# Patient Record
Sex: Female | Born: 1980 | Race: White | Hispanic: No | Marital: Single | Smoking: Current every day smoker
Health system: Southern US, Community
[De-identification: ages and names within clinical notes are randomized; demographics above are authoritative.]

## PROBLEM LIST (undated history)

## (undated) ENCOUNTER — Ambulatory Visit
Payer: PRIVATE HEALTH INSURANCE | Attending: Student in an Organized Health Care Education/Training Program | Primary: Student in an Organized Health Care Education/Training Program

## (undated) ENCOUNTER — Ambulatory Visit

## (undated) ENCOUNTER — Encounter: Attending: Allergy & Immunology | Primary: Allergy & Immunology

## (undated) ENCOUNTER — Encounter

## (undated) ENCOUNTER — Ambulatory Visit: Payer: PRIVATE HEALTH INSURANCE

## (undated) ENCOUNTER — Ambulatory Visit: Payer: PRIVATE HEALTH INSURANCE | Attending: Allergy & Immunology | Primary: Allergy & Immunology

## (undated) ENCOUNTER — Ambulatory Visit: Payer: PRIVATE HEALTH INSURANCE | Attending: Clinical Neurophysiology | Primary: Clinical Neurophysiology

## (undated) ENCOUNTER — Telehealth

## (undated) ENCOUNTER — Inpatient Hospital Stay (HOSPITAL_COMMUNITY): Payer: Self-pay

## (undated) DIAGNOSIS — O09529 Supervision of elderly multigravida, unspecified trimester: Secondary | ICD-10-CM

## (undated) DIAGNOSIS — Z91018 Allergy to other foods: Secondary | ICD-10-CM

## (undated) DIAGNOSIS — D649 Anemia, unspecified: Secondary | ICD-10-CM

## (undated) HISTORY — PX: ABDOMINAL HYSTERECTOMY: SHX81

## (undated) HISTORY — PX: TONSILLECTOMY: SUR1361

---

## 2013-05-21 NOTE — L&D Delivery Note (Signed)
Delivery Note At 11:39 AM a viable female was delivered via VBAC, Spontaneous (Presentation: Left Occiput Anterior).  APGAR: 8, 9; weight .   Placenta status: Intact, Spontaneous.  Cord: 3 vessels with the following complications: None.  Anesthesia: Epidural Local : 10cc administered Episiotomy: None Lacerations: bilateral labial, periurethrals, 2nd degree Suture Repair: 2.0 vicryl rapide Est. Blood Loss (mL): 350  Mom to postpartum.  Baby to Couplet care / Skin to Skin.  Sandra Meyer 02/20/2014, 12:16 PM

## 2013-07-09 ENCOUNTER — Encounter: Payer: Self-pay | Admitting: Obstetrics & Gynecology

## 2013-07-16 ENCOUNTER — Inpatient Hospital Stay (HOSPITAL_COMMUNITY)
Admission: AD | Admit: 2013-07-16 | Discharge: 2013-07-16 | Disposition: A | Discharge status: Home / Self Care | Payer: Medicaid Other | Source: Ambulatory Visit | Attending: Obstetrics & Gynecology | Admitting: Obstetrics & Gynecology

## 2013-07-16 ENCOUNTER — Encounter (HOSPITAL_COMMUNITY): Payer: Self-pay | Admitting: *Deleted

## 2013-07-16 DIAGNOSIS — O211 Hyperemesis gravidarum with metabolic disturbance: Secondary | ICD-10-CM | POA: Insufficient documentation

## 2013-07-16 DIAGNOSIS — R55 Syncope and collapse: Secondary | ICD-10-CM | POA: Insufficient documentation

## 2013-07-16 DIAGNOSIS — O219 Vomiting of pregnancy, unspecified: Secondary | ICD-10-CM

## 2013-07-16 DIAGNOSIS — Z87891 Personal history of nicotine dependence: Secondary | ICD-10-CM | POA: Insufficient documentation

## 2013-07-16 DIAGNOSIS — E860 Dehydration: Secondary | ICD-10-CM | POA: Insufficient documentation

## 2013-07-16 HISTORY — DX: Anemia, unspecified: D64.9

## 2013-07-16 LAB — CBC
HCT: 33.4 % — ABNORMAL LOW (ref 36.0–46.0)
Hemoglobin: 11.4 g/dL — ABNORMAL LOW (ref 12.0–15.0)
MCH: 29.9 pg (ref 26.0–34.0)
MCHC: 34.1 g/dL (ref 30.0–36.0)
MCV: 87.7 fL (ref 78.0–100.0)
PLATELETS: 194 10*3/uL (ref 150–400)
RBC: 3.81 MIL/uL — ABNORMAL LOW (ref 3.87–5.11)
RDW: 13.2 % (ref 11.5–15.5)
WBC: 9.3 10*3/uL (ref 4.0–10.5)

## 2013-07-16 LAB — URINALYSIS, ROUTINE W REFLEX MICROSCOPIC
BILIRUBIN URINE: NEGATIVE
Glucose, UA: NEGATIVE mg/dL
Hgb urine dipstick: NEGATIVE
KETONES UR: NEGATIVE mg/dL
Leukocytes, UA: NEGATIVE
Nitrite: NEGATIVE
Protein, ur: NEGATIVE mg/dL
Specific Gravity, Urine: >1.03 — ABNORMAL HIGH (ref 1.005–1.030)
UROBILINOGEN UA: 0.2 mg/dL (ref 0.0–1.0)
pH: 6 (ref 5.0–8.0)

## 2013-07-16 LAB — BASIC METABOLIC PANEL
BUN: 10 mg/dL (ref 6–23)
CALCIUM: 9 mg/dL (ref 8.4–10.5)
CO2: 21 meq/L (ref 19–32)
CREATININE: 0.64 mg/dL (ref 0.50–1.10)
Chloride: 103 mEq/L (ref 96–112)
GFR calc non Af Amer: >90 mL/min (ref >90)
Glucose, Bld: 85 mg/dL (ref 70–99)
Potassium: 4.3 mEq/L (ref 3.7–5.3)
Sodium: 135 mEq/L — ABNORMAL LOW (ref 137–147)

## 2013-07-16 LAB — POCT PREGNANCY, URINE: Preg Test, Ur: POSITIVE — AB

## 2013-07-16 MED ORDER — PROMETHAZINE HCL 25 MG/ML IJ SOLN
INTRAVENOUS | Status: DC
  Administered 2013-07-16: 14:00:00 via INTRAVENOUS
  Filled 2013-07-16 (×4): qty 1000

## 2013-07-16 MED ORDER — PROMETHAZINE HCL 12.5 MG PO TABS: 12.5000 mg | ORAL_TABLET | Freq: Four times a day (QID) | ORAL | Status: DC | PRN

## 2013-07-16 MED ORDER — ONDANSETRON 4 MG PO TBDP: 4.0000 mg | ORAL_TABLET | Freq: Four times a day (QID) | ORAL | Status: DC | PRN

## 2013-07-16 MED ORDER — ONDANSETRON 4 MG PO TBDP
4.0000 mg | ORAL_TABLET | Freq: Once | ORAL | Status: AC
  Administered 2013-07-16: 4 mg via ORAL
  Filled 2013-07-16: qty 1

## 2013-07-16 MED ORDER — LACTATED RINGERS IV BOLUS (SEPSIS)
1000.0000 mL | Freq: Once | INTRAVENOUS | Status: AC
  Administered 2013-07-16: 1000 mL via INTRAVENOUS

## 2013-07-16 NOTE — MAU Note (Signed)
Pt stated that she has been having non stop n/v since she has been pregnant noat keeping anything dsown. Stated she has had her vision is going in and out like she is going to pass out. Also c/o sharp pains in her right side.

## 2013-07-16 NOTE — MAU Provider Note (Signed)
Chief Complaint: Near Syncope   First Provider Initiated Contact with Patient 07/16/13 1339     SUBJECTIVE HPI: Sandra Meyer is a 33 y.o. G3P1011 at [redacted]w[redacted]d by LMP who presents with N/V, presycope.   RN note: Pt stated that she has been having non stop n/v since she has been pregnant not keeping anything down. Stated she has had her vision is going in and out like she is going to pass out. Also c/o sharp pains in her right side.  She reports retaining no food or fluids except sips of water for over 24 hours and has had daily vomiting over the last 3-4 weeks. She has felt faint and dizzy today and on other occasions but has not had any syncopal episodes. Tried Pepto-Bismol without relief. She describes having right flank pain at axillary line level of umbilicus which is related to vomiting. Not having pain at present. No lower abdominal pain or vaginal bleeding. No irritative vaginal discharge. No dysuria, urgency or frequency of urination, hematuria. NPC but has applied for [facility].  Past Medical History  Diagnosis Date  . Anemia    OB History  Gravida Para Term Preterm AB SAB TAB Ectopic Multiple Living  3 1 1  1 1    1     # Outcome Date GA Lbr Len/2nd Weight Sex Delivery Anes PTL Lv  3 TRM           2 SAB           1 GRA              Past Surgical History  Procedure Laterality Date  . Cesarean section     History   Social History  . Marital Status: Unknown    Spouse Name: N/A    Number of Children: N/A  . Years of Education: N/A   Occupational History  . Not on file.   Social History Main Topics  . Smoking status: Former Smoker    Types: Cigarettes    Quit date: 11/13/2012  . Smokeless tobacco: Not on file  . Alcohol Use: No  . Drug Use: No  . Sexual Activity: Yes   Other Topics Concern  . Not on file   Social History Narrative  . No narrative on file   No current facility-administered medications on file prior to encounter.   No current outpatient prescriptions  on file prior to encounter.   Allergies  Allergen Reactions  . Codeine Anaphylaxis    ROS: Pertinent items in HPI  OBJECTIVE Blood pressure 113/75, pulse 87, temperature 98 F (36.7 C), temperature source Oral, resp. rate 18, height 5\' 11"  (1.803 m), weight 168 lb 3.2 oz (76.295 kg), last menstrual period 05/10/2013. GENERAL: Well-developed, well-nourished female in no acute distress.  HEENT: Normocephalic HEART: RRR w/o murmur RESP: normal effort. Lungs CTA bilaterally ABDOMEN: Soft, non-tender. Uterus palpable 1-2 FB above SP EXTREMITIES: Nontender, no edema NEURO: Alert and oriented   LAB RESULTS Results for orders placed during the hospital encounter of 07/16/13 (from the past 24 hour(s))  URINALYSIS, ROUTINE W REFLEX MICROSCOPIC     Status: Abnormal   Collection Time    07/16/13  1:05 PM      Result Value Ref Range   Color, Urine YELLOW  YELLOW   APPearance CLEAR  CLEAR   Specific Gravity, Urine >1.030 (*) 1.005 - 1.030   pH 6.0  5.0 - 8.0   Glucose, UA NEGATIVE  NEGATIVE mg/dL   Hgb urine dipstick NEGATIVE  NEGATIVE   Bilirubin Urine NEGATIVE  NEGATIVE   Ketones, ur NEGATIVE  NEGATIVE mg/dL   Protein, ur NEGATIVE  NEGATIVE mg/dL   Urobilinogen, UA 0.2  0.0 - 1.0 mg/dL   Nitrite NEGATIVE  NEGATIVE   Leukocytes, UA NEGATIVE  NEGATIVE  POCT PREGNANCY, URINE     Status: Abnormal   Collection Time    07/16/13  1:31 PM      Result Value Ref Range   Preg Test, Ur POSITIVE (*) NEGATIVE  CBC     Status: Abnormal   Collection Time    07/16/13  2:20 PM      Result Value Ref Range   WBC 9.3  4.0 - 10.5 K/uL   RBC 3.81 (*) 3.87 - 5.11 MIL/uL   Hemoglobin 11.4 (*) 12.0 - 15.0 g/dL   HCT 16.133.4 (*) 09.636.0 - 04.546.0 %   MCV 87.7  78.0 - 100.0 fL   MCH 29.9  26.0 - 34.0 pg   MCHC 34.1  30.0 - 36.0 g/dL   RDW 40.913.2  81.111.5 - 91.415.5 %   Platelets 194  150 - 400 K/uL  BASIC METABOLIC PANEL     Status: Abnormal   Collection Time    07/16/13  2:20 PM      Result Value Ref Range    Sodium 135 (*) 137 - 147 mEq/L   Potassium 4.3  3.7 - 5.3 mEq/L   Chloride 103  96 - 112 mEq/L   CO2 21  19 - 32 mEq/L   Glucose, Bld 85  70 - 99 mg/dL   BUN 10  6 - 23 mg/dL   Creatinine, Ser 7.820.64  0.50 - 1.10 mg/dL   Calcium 9.0  8.4 - 95.610.5 mg/dL   GFR calc non Af Amer >90  >90 mL/min   GFR calc Af Amer >90  >90 mL/min    IMAGING No results found.  MAU COURSE  Zofran 4mg  SL and IV LR1000 with Phenergan 25 mg given with relief  ASSESSMENT 1. Near syncope   2. Nausea and vomiting in pregnancy prior to [redacted] weeks gestation   3. Dehydration, mild   G3P1011 at 3133w4d  PLAN Discharge home    Medication List    STOP taking these medications       bismuth subsalicylate 262 MG/15ML suspension  Commonly known as:  PEPTO BISMOL      TAKE these medications       ondansetron 4 MG disintegrating tablet  Commonly known as:  ZOFRAN ODT  Take 1 tablet (4 mg total) by mouth every 6 (six) hours as needed for nausea.     prenatal multivitamin Tabs tablet  Take 1 tablet by mouth daily at 12 noon.     promethazine 12.5 MG tablet  Commonly known as:  PHENERGAN  Take 1 tablet (12.5 mg total) by mouth every 6 (six) hours as needed for nausea or vomiting.       Follow-up Information   [facility].   Contact information:   [address] [place] [id number] [phone]      Danae Orleanseirdre C Glynda Soliday, CNM 07/16/2013  1:42 PM

## 2013-07-16 NOTE — Discharge Instructions (Signed)
Hyperemesis Gravidarum Diet Hyperemesis gravidarum is a severe form of morning sickness. It is characterized by frequent and severe vomiting. It happens during the first trimester of pregnancy. It may be caused by the rapid hormone changes that happen during pregnancy. It is associated with a 5% weight loss of pre-pregnancy weight. The hyperemesis diet may be used to lessen symptoms of nausea and vomiting. EATING GUIDELINES  Eat 5 to 6 small meals daily instead of 3 large meals.  Avoid foods with strong smells.  Avoid drinking 30 minutes before and after meals.  Avoid fried or high-fat foods, such as butter and cream sauces.  Starchy foods are usually well-tolerated, such as cereal, toast, bread, potatoes, pasta, rice, and pretzels.  Eat crackers before you get out of bed in the morning.  Avoid spicy foods.  Ginger may help with nausea. Add  tsp ginger to hot tea or choose ginger tea.  Continue to take your prenatal vitamins as directed by your caregiver. SAMPLE MEAL PLAN Breakfast    cup oatmeal  1 slice toast  1 tsp heart-healthy margarine  1 tsp jelly  1 scrambled egg Midmorning Snack   1 cup low-fat yogurt Lunch   Plain ham sandwich  Carrot or celery sticks  1 small apple  3 graham crackers Midafternoon Snack   Cheese and crackers Dinner  4 oz pork tenderloin  1 small baked potato  1 tsp margarine   cup broccoli   cup grapes Evening Snack  1 cup pudding Document Released: 03/04/2007 Document Revised: 07/30/2011 Document Reviewed: 10/07/2012 ExitCare Patient Information 2014 [place]. Prenatal Care [facility]    [facility]  [phone]     Phone: [phone]          [facility]                      [facility] [place]  @[email or web address]     Phone: [phone]  Phone: [phone]         [facility]     Phone:  [phone]  Phone: [phone]           [facility] [place]                hone: [phone]  Phone: [phone]         [facility] Dr. Francoise CeoBernard Marshall      Phone: [phone]  Phone: [phone]         [facility]                Phone: [phone]  [facility]   [address]Phone:[phone]    [facility]()          Phone: [phone] [facility]   Phone: [phone]

## 2013-07-19 ENCOUNTER — Inpatient Hospital Stay (HOSPITAL_COMMUNITY): Payer: Medicaid Other

## 2013-07-19 ENCOUNTER — Encounter (HOSPITAL_COMMUNITY): Payer: Self-pay | Admitting: *Deleted

## 2013-07-19 ENCOUNTER — Inpatient Hospital Stay (HOSPITAL_COMMUNITY)
Admission: AD | Admit: 2013-07-19 | Discharge: 2013-07-19 | Disposition: A | Discharge status: Home / Self Care | Payer: Medicaid Other | Source: Ambulatory Visit | Attending: Obstetrics & Gynecology | Admitting: Obstetrics & Gynecology

## 2013-07-19 DIAGNOSIS — O208 Other hemorrhage in early pregnancy: Secondary | ICD-10-CM | POA: Insufficient documentation

## 2013-07-19 DIAGNOSIS — O468X1 Other antepartum hemorrhage, first trimester: Secondary | ICD-10-CM

## 2013-07-19 DIAGNOSIS — Z87891 Personal history of nicotine dependence: Secondary | ICD-10-CM | POA: Insufficient documentation

## 2013-07-19 DIAGNOSIS — O209 Hemorrhage in early pregnancy, unspecified: Secondary | ICD-10-CM

## 2013-07-19 DIAGNOSIS — O418X10 Other specified disorders of amniotic fluid and membranes, first trimester, not applicable or unspecified: Secondary | ICD-10-CM

## 2013-07-19 DIAGNOSIS — O4590 Premature separation of placenta, unspecified, unspecified trimester: Secondary | ICD-10-CM

## 2013-07-19 LAB — CBC
HEMATOCRIT: 35.6 % — AB (ref 36.0–46.0)
HEMOGLOBIN: 12.4 g/dL (ref 12.0–15.0)
MCH: 30.5 pg (ref 26.0–34.0)
MCHC: 34.8 g/dL (ref 30.0–36.0)
MCV: 87.5 fL (ref 78.0–100.0)
Platelets: 211 10*3/uL (ref 150–400)
RBC: 4.07 MIL/uL (ref 3.87–5.11)
RDW: 13.1 % (ref 11.5–15.5)
WBC: 9.2 10*3/uL (ref 4.0–10.5)

## 2013-07-19 LAB — URINALYSIS, ROUTINE W REFLEX MICROSCOPIC
BILIRUBIN URINE: NEGATIVE
GLUCOSE, UA: NEGATIVE mg/dL
KETONES UR: NEGATIVE mg/dL
Leukocytes, UA: NEGATIVE
Nitrite: NEGATIVE
PROTEIN: NEGATIVE mg/dL
Specific Gravity, Urine: >1.03 — ABNORMAL HIGH (ref 1.005–1.030)
Urobilinogen, UA: 0.2 mg/dL (ref 0.0–1.0)
pH: 5.5 (ref 5.0–8.0)

## 2013-07-19 LAB — URINE MICROSCOPIC-ADD ON

## 2013-07-19 LAB — HCG, QUANTITATIVE, PREGNANCY: hCG, Beta Chain, Quant, S: 69775 m[IU]/mL — ABNORMAL HIGH (ref <5)

## 2013-07-19 LAB — WET PREP, GENITAL
CLUE CELLS WET PREP: NONE SEEN
TRICH WET PREP: NONE SEEN
YEAST WET PREP: NONE SEEN

## 2013-07-19 LAB — ABO/RH: ABO/RH(D): A POS

## 2013-07-19 NOTE — MAU Note (Signed)
Pt presents with complaints of vaginal bleeding after intercourse yesterday. She says it started out as pink in color and turned to a rusty brown. Pt has cramping in both of her lower sides

## 2013-07-19 NOTE — MAU Provider Note (Signed)

## 2013-07-19 NOTE — Discharge Instructions (Signed)
Vaginal Bleeding During Pregnancy, First Trimester A small amount of bleeding (spotting) from the vagina is relatively common in early pregnancy. It usually stops on its own. Various things may cause bleeding or spotting in early pregnancy. Some bleeding may be related to the pregnancy, and some may not. In most cases, the bleeding is normal and is not a problem. However, bleeding can also be a sign of something serious. Be sure to tell your health care provider about any vaginal bleeding right away. Some possible causes of vaginal bleeding during the first trimester include:  Infection or inflammation of the cervix.  Growths (polyps) on the cervix.  Miscarriage or threatened miscarriage.  Pregnancy tissue has developed outside of the uterus and in a fallopian tube (tubal pregnancy).  Tiny cysts have developed in the uterus instead of pregnancy tissue (molar pregnancy). HOME CARE INSTRUCTIONS  Watch your condition for any changes. The following actions may help to lessen any discomfort you are feeling:  Follow your health care provider's instructions for limiting your activity. If your health care provider orders bed rest, you may need to stay in bed and only get up to use the bathroom. However, your health care provider may allow you to continue light activity.  If needed, make plans for someone to help with your regular activities and responsibilities while you are on bed rest.  Keep track of the number of pads you use each day, how often you change pads, and how soaked (saturated) they are. Write this down.  Do not use tampons. Do not douche.  Do not have sexual intercourse or orgasms until approved by your health care provider.  If you pass any tissue from your vagina, save the tissue so you can show it to your health care provider.  Only take over-the-counter or prescription medicines as directed by your health care provider.  Do not take aspirin because it can make you  bleed.  Keep all follow-up appointments as directed by your health care provider. SEEK MEDICAL CARE IF:  You have any vaginal bleeding during any part of your pregnancy.  You have cramps or labor pains. SEEK IMMEDIATE MEDICAL CARE IF:   You have severe cramps in your back or belly (abdomen).  You have a fever, not controlled by medicine.  You pass large clots or tissue from your vagina.  Your bleeding increases.  You feel lightheaded or weak, or you have fainting episodes.  You have chills.  You are leaking fluid or have a gush of fluid from your vagina.  You pass out while having a bowel movement. MAKE SURE YOU:  Understand these instructions.  Will watch your condition.  Will get help right away if you are not doing well or get worse. Document Released: 02/14/2005 Document Revised: 02/25/2013 Document Reviewed: 01/12/2013 [facility] Patient Information 2014 [place].  Pelvic Rest Pelvic rest is sometimes recommended for women when:   The placenta is partially or completely covering the opening of the cervix (placenta previa).  There is bleeding between the uterine wall and the amniotic sac in the first trimester (subchorionic hemorrhage).  The cervix begins to open without labor starting (incompetent cervix, cervical insufficiency).  The labor is too early (preterm labor). HOME CARE INSTRUCTIONS  Do not have sexual intercourse, stimulation, or an orgasm.  Do not use tampons, douche, or put anything in the vagina.  Do not lift anything over 10 pounds (4.5 kg).  Avoid strenuous activity or straining your pelvic muscles. SEEK MEDICAL CARE IF:  You have any vaginal bleeding during pregnancy. Treat this as a potential emergency. °· You have cramping pain felt low in the stomach (stronger than menstrual cramps). °· You notice vaginal discharge (watery, mucus, or bloody). °· You have a low, dull backache. °· There are regular contractions or uterine  tightening. °SEEK IMMEDIATE MEDICAL CARE IF: °You have vaginal bleeding and have placenta previa.  °Document Released: 09/01/2010 Document Revised: 07/30/2011 Document Reviewed: 09/01/2010 °ExitCare® Patient Information ©2014 [employer], LLC. ° °

## 2013-07-19 NOTE — MAU Provider Note (Signed)
Chief Complaint: Vaginal Bleeding   First Provider Initiated Contact with Patient 07/19/13 1136     SUBJECTIVE HPI: Sandra Meyer is a 33 y.o. G3P1011 at [redacted]w[redacted]d by LMP who presents to maternity admissions reporting spotting x24 hours after intercourse yesterday.  She is tearful and worried about the pregnancy.  She has not yet started care as she is waiting on insurance.  She denies exposure to STDs, vaginal itching/burning, urinary symptoms, h/a, dizziness, n/v, or fever/chills.    Past Medical History  Diagnosis Date  . Anemia    Past Surgical History  Procedure Laterality Date  . Cesarean section     History   Social History  . Marital Status: Unknown    Spouse Name: N/A    Number of Children: N/A  . Years of Education: N/A   Occupational History  . Not on file.   Social History Main Topics  . Smoking status: Former Smoker    Types: Cigarettes    Quit date: 11/13/2012  . Smokeless tobacco: Never Used  . Alcohol Use: No  . Drug Use: No  . Sexual Activity: Yes    Birth Control/ Protection: None   Other Topics Concern  . Not on file   Social History Narrative  . No narrative on file   No current facility-administered medications on file prior to encounter.   Current Outpatient Prescriptions on File Prior to Encounter  Medication Sig Dispense Refill  . promethazine (PHENERGAN) 12.5 MG tablet Take 1 tablet (12.5 mg total) by mouth every 6 (six) hours as needed for nausea or vomiting.  30 tablet  0  . ondansetron (ZOFRAN ODT) 4 MG disintegrating tablet Take 1 tablet (4 mg total) by mouth every 6 (six) hours as needed for nausea.  20 tablet  0  . Prenatal Vit-Fe Fumarate-FA (PRENATAL MULTIVITAMIN) TABS tablet Take 1 tablet by mouth daily at 12 noon. Patient  Tries to take but makes her sick       Allergies  Allergen Reactions  . Codeine Anaphylaxis    ROS: Pertinent items in HPI  OBJECTIVE Blood pressure 114/74, pulse 86, resp. rate 20, height 5\' 10"  (1.778 m),  weight 74.844 kg (165 lb), last menstrual period 05/10/2013. GENERAL: Well-developed, well-nourished female in no acute distress.  HEENT: Normocephalic HEART: normal rate RESP: normal effort ABDOMEN: Soft, non-tender EXTREMITIES: Nontender, no edema NEURO: Alert and oriented Pelvic exam: Cervix pink, visually closed, without lesion, scant dark brown blood, vaginal walls and external genitalia normal Bimanual exam: Cervix 0/long/high, firm, anterior, neg CMT, uterus nontender, ~9 week size, adnexa without tenderness, enlargement, or mass  LAB RESULTS Results for orders placed during the hospital encounter of 07/19/13 (from the past 24 hour(s))  URINALYSIS, ROUTINE W REFLEX MICROSCOPIC     Status: Abnormal   Collection Time    07/19/13 10:00 AM      Result Value Ref Range   Color, Urine YELLOW  YELLOW   APPearance CLEAR  CLEAR   Specific Gravity, Urine >1.030 (*) 1.005 - 1.030   pH 5.5  5.0 - 8.0   Glucose, UA NEGATIVE  NEGATIVE mg/dL   Hgb urine dipstick SMALL (*) NEGATIVE   Bilirubin Urine NEGATIVE  NEGATIVE   Ketones, ur NEGATIVE  NEGATIVE mg/dL   Protein, ur NEGATIVE  NEGATIVE mg/dL   Urobilinogen, UA 0.2  0.0 - 1.0 mg/dL   Nitrite NEGATIVE  NEGATIVE   Leukocytes, UA NEGATIVE  NEGATIVE  URINE MICROSCOPIC-ADD ON     Status: None   Collection  Time    07/19/13 10:00 AM      Result Value Ref Range   Squamous Epithelial / LPF RARE  RARE   RBC / HPF 0-2  <3 RBC/hpf   Bacteria, UA RARE  RARE   Urine-Other MUCOUS PRESENT    CBC     Status: Abnormal   Collection Time    07/19/13 11:17 AM      Result Value Ref Range   WBC 9.2  4.0 - 10.5 K/uL   RBC 4.07  3.87 - 5.11 MIL/uL   Hemoglobin 12.4  12.0 - 15.0 g/dL   HCT 16.135.6 (*) 09.636.0 - 04.546.0 %   MCV 87.5  78.0 - 100.0 fL   MCH 30.5  26.0 - 34.0 pg   MCHC 34.8  30.0 - 36.0 g/dL   RDW 40.913.1  81.111.5 - 91.415.5 %   Platelets 211  150 - 400 K/uL  HCG, QUANTITATIVE, PREGNANCY     Status: Abnormal   Collection Time    07/19/13 11:17 AM       Result Value Ref Range   hCG, Beta Chain, Sharene ButtersQuant, [place] [id number] (*) <5 mIU/mL  ABO/RH     Status: None   Collection Time    07/19/13 11:17 AM      Result Value Ref Range   ABO/RH(D) A POS    WET PREP, GENITAL     Status: Abnormal   Collection Time    07/19/13 11:33 AM      Result Value Ref Range   Yeast Wet Prep HPF POC NONE SEEN  NONE SEEN   Trich, Wet Prep NONE SEEN  NONE SEEN   Clue Cells Wet Prep HPF POC NONE SEEN  NONE SEEN   WBC, Wet Prep HPF POC FEW (*) NONE SEEN    IMAGING [place] Ob Comp Less 14 Wks  07/19/2013   CLINICAL DATA:  Ten weeks pregnant with bleeding.  EXAM: OBSTETRIC <14 WK ULTRASOUND  TECHNIQUE: Transabdominal ultrasound was performed for evaluation of the gestation as well as the maternal uterus and adnexal regions.  COMPARISON:  None.  FINDINGS: Intrauterine gestational sac: Visualized/normal in shape.  Yolk sac:  Present  Embryo:  Present  Cardiac Activity: Present  Heart Rate: 174 bpm  CRL:   28  mm   9 w 5 d                  US EDC: 02/16/2014  Maternal uterus/adnexae: Small volume subchorionic hemorrhage. Identified anteriorly, including on image 23.  Both ovaries within normal limits. No significant free fluid.  IMPRESSION: 1. Intrauterine pregnancy of 9 weeks 5 days with fetal heart rate of 174 beats per min. 2. Small volume subchorionic hemorrhage.   Electronically Signed   By: Jeronimo GreavesKyle  Talbot M.D.   On: 07/19/2013 13:13    ASSESSMENT 1. Subchorionic hemorrhage in first trimester   2. Vaginal bleeding in pregnant patient at less than [redacted] weeks gestation     PLAN Discharge home Pt has health dept appointment tomorrow but [facility]  F/U as scheduled with health dept--[facility] as [facility]--call to make appointment.  Pelvic rest Return to MAU as needed    Medication List         ondansetron 4 MG disintegrating tablet  Commonly known as:  ZOFRAN ODT  Take 1 tablet (4 mg total) by mouth every 6 (six) hours  as needed for nausea.     prenatal multivitamin  Tabs tablet  Take 1 tablet by mouth daily at 12 noon. Patient  Tries to take but makes her sick     promethazine 12.5 MG tablet  Commonly known as:  PHENERGAN  Take 1 tablet (12.5 mg total) by mouth every 6 (six) hours as needed for nausea or vomiting.       Follow-up Information   Please follow up. (With prenatal provider of your choice. Return to MAU as needed.)       Sharen Counter Certified Nurse-Midwife 07/19/2013  2:38 PM

## 2013-07-20 ENCOUNTER — Other Ambulatory Visit: Payer: Self-pay

## 2013-07-20 LAB — OB RESULTS CONSOLE ANTIBODY SCREEN: Antibody Screen: NEGATIVE

## 2013-07-20 LAB — OB RESULTS CONSOLE RPR: RPR: NONREACTIVE

## 2013-07-20 LAB — GC/CHLAMYDIA PROBE AMP
CT Probe RNA: NEGATIVE
GC Probe RNA: NEGATIVE

## 2013-07-20 LAB — OB RESULTS CONSOLE RUBELLA ANTIBODY, IGM: RUBELLA: IMMUNE

## 2013-07-20 LAB — OB RESULTS CONSOLE GC/CHLAMYDIA
CHLAMYDIA, DNA PROBE: NEGATIVE
Gonorrhea: NEGATIVE

## 2013-07-20 LAB — OB RESULTS CONSOLE ABO/RH: RH Type: POSITIVE

## 2013-07-20 LAB — OB RESULTS CONSOLE HEPATITIS B SURFACE ANTIGEN: HEP B S AG: NEGATIVE

## 2013-07-20 LAB — OB RESULTS CONSOLE HIV ANTIBODY (ROUTINE TESTING): HIV: NONREACTIVE

## 2013-08-03 ENCOUNTER — Other Ambulatory Visit (HOSPITAL_COMMUNITY): Payer: Self-pay | Admitting: Nurse Practitioner

## 2013-08-03 DIAGNOSIS — Z3682 Encounter for antenatal screening for nuchal translucency: Secondary | ICD-10-CM

## 2013-08-11 ENCOUNTER — Other Ambulatory Visit: Payer: Self-pay

## 2013-08-11 ENCOUNTER — Encounter (HOSPITAL_COMMUNITY): Payer: Self-pay

## 2013-08-11 ENCOUNTER — Ambulatory Visit (HOSPITAL_COMMUNITY)
Admission: RE | Admit: 2013-08-11 | Discharge: 2013-08-11 | Disposition: A | Discharge status: Home / Self Care | Payer: Medicaid Other | Source: Ambulatory Visit | Attending: Nurse Practitioner | Admitting: Nurse Practitioner

## 2013-08-11 DIAGNOSIS — Z3682 Encounter for antenatal screening for nuchal translucency: Secondary | ICD-10-CM

## 2013-08-11 DIAGNOSIS — O34219 Maternal care for unspecified type scar from previous cesarean delivery: Secondary | ICD-10-CM | POA: Insufficient documentation

## 2013-08-11 DIAGNOSIS — Z3689 Encounter for other specified antenatal screening: Secondary | ICD-10-CM | POA: Insufficient documentation

## 2013-08-11 DIAGNOSIS — O351XX0 Maternal care for (suspected) chromosomal abnormality in fetus, not applicable or unspecified: Secondary | ICD-10-CM | POA: Insufficient documentation

## 2013-08-11 DIAGNOSIS — O09529 Supervision of elderly multigravida, unspecified trimester: Secondary | ICD-10-CM | POA: Insufficient documentation

## 2013-08-11 DIAGNOSIS — O3510X0 Maternal care for (suspected) chromosomal abnormality in fetus, unspecified, not applicable or unspecified: Secondary | ICD-10-CM | POA: Insufficient documentation

## 2013-08-11 HISTORY — DX: Supervision of elderly multigravida, unspecified trimester: O09.529

## 2013-08-27 ENCOUNTER — Ambulatory Visit (INDEPENDENT_AMBULATORY_CARE_PROVIDER_SITE_OTHER): Payer: Medicaid Other | Admitting: Family Medicine

## 2013-08-27 ENCOUNTER — Encounter: Payer: Self-pay | Admitting: Family Medicine

## 2013-08-27 VITALS — BP 93/69 | Temp 97.7°F | Wt 168.9 lb

## 2013-08-27 DIAGNOSIS — O34219 Maternal care for unspecified type scar from previous cesarean delivery: Secondary | ICD-10-CM

## 2013-08-27 DIAGNOSIS — Z3480 Encounter for supervision of other normal pregnancy, unspecified trimester: Secondary | ICD-10-CM

## 2013-08-27 LAB — POCT URINALYSIS DIP (DEVICE)
Bilirubin Urine: NEGATIVE
Glucose, UA: NEGATIVE mg/dL
Hgb urine dipstick: NEGATIVE
Ketones, ur: NEGATIVE mg/dL
LEUKOCYTES UA: NEGATIVE
Nitrite: NEGATIVE
Protein, ur: NEGATIVE mg/dL
Specific Gravity, Urine: 1.02 (ref 1.005–1.030)
UROBILINOGEN UA: 0.2 mg/dL (ref 0.0–1.0)
pH: 7 (ref 5.0–8.0)

## 2013-08-27 NOTE — Progress Notes (Signed)
  Subjective:    Sandra Meyer is a G3P1011 5848w3d being seen today for her first obstetrical visit.  Her obstetrical history is significant for hx of prior c/s x1 for FTP after failed induction for PROM, FOB with hep C (her hep C neg). Patient does intend to breast feed. Pregnancy history fully reviewed.  Patient reports no bleeding, no contractions, no cramping and no leaking.  Filed Vitals:   08/27/13 0928  BP: 93/69  Temp: 97.7 F (36.5 C)  Weight: 168 lb 14.4 oz (76.613 kg)    HISTORY: OB History  Gravida Para Term Preterm AB SAB TAB Ectopic Multiple Living  3 1 1  1 1    1     # Outcome Date GA Lbr Len/2nd Weight Sex Delivery Anes PTL Lv  3 CUR           2 SAB 2014          1 TRM 05/02/09  26:00  M LTCS EPI  Y     Comments: Induced because ROM and did go into spontaneous labor, c/s for ftp past 1cm. [facility]      Past Medical History  Diagnosis Date  . Anemia   . AMA (advanced maternal age) multigravida 35+    Past Surgical History  Procedure Laterality Date  . Cesarean section    . Tonsillectomy     Family History  Problem Relation Age of Onset  . Cancer Father      Exam    Uterus:   appropriate for dates  System: Breast:  deferred   Skin: normal coloration and turgor, no rashes    Neurologic: oriented, normal, normal mood   Extremities: normal strength, tone, and muscle mass   HEENT PERRLA and extra ocular movement intact   Mouth/Teeth mucous membranes moist, pharynx normal without lesions   Neck supple   Cardiovascular: regular rate and rhythm   Respiratory:  appears well, vitals normal, no respiratory distress, acyanotic, normal RR, ear and throat exam is normal, neck free of mass or lymphadenopathy, chest clear, no wheezing, crepitations, rhonchi, normal symmetric air entry   Abdomen: soft, non-tender; bowel sounds normal; no masses,  no organomegaly      Assessment:    Pregnancy: G3P1011 There are no active problems to  display for this patient.       Plan:     Initial labs reviewed from HD Prenatal vitamins. Problem list reviewed and updated. Genetic Screening reviewed- 1st trimester screen done and neg but AFP drawn today  Ultrasound discussed; fetal survey: ordered.  Follow up in 4 weeks. 50% of 30 min visit spent on counseling and coordination of care.     Sandra Meyer 08/27/2013

## 2013-08-27 NOTE — Progress Notes (Signed)
P= 78, here for initial visit- transferred from health department per patient request. Given new patient information. C/o lower back pain at times.

## 2013-08-27 NOTE — Patient Instructions (Signed)
Second Trimester of Pregnancy The second trimester is from week 13 through week 28, months 4 through 6. The second trimester is often a time when you feel your best. Your body has also adjusted to being pregnant, and you begin to feel better physically. Usually, morning sickness has lessened or quit completely, you may have more energy, and you may have an increase in appetite. The second trimester is also a time when the fetus is growing rapidly. At the end of the sixth month, the fetus is about 9 inches long and weighs about 1 pounds. You will likely begin to feel the baby move (quickening) between 18 and 20 weeks of the pregnancy. BODY CHANGES Your body goes through many changes during pregnancy. The changes vary from woman to woman.   Your weight will continue to increase. You will notice your lower abdomen bulging out.  You may begin to get stretch marks on your hips, abdomen, and breasts.  You may develop headaches that can be relieved by medicines approved by your caregiver.  You may urinate more often because the fetus is pressing on your bladder.  You may develop or continue to have heartburn as a result of your pregnancy.  You may develop constipation because certain hormones are causing the muscles that push waste through your intestines to slow down.  You may develop hemorrhoids or swollen, bulging veins (varicose veins).  You may have back pain because of the weight gain and pregnancy hormones relaxing your joints between the bones in your pelvis and as a result of a shift in weight and the muscles that support your balance.  Your breasts will continue to grow and be tender.  Your gums may bleed and may be sensitive to brushing and flossing.  Dark spots or blotches (chloasma, mask of pregnancy) may develop on your face. This will likely fade after the baby is born.  A dark line from your belly button to the pubic area (linea nigra) may appear. This will likely fade after the  baby is born. WHAT TO EXPECT AT YOUR PRENATAL VISITS During a routine prenatal visit:  You will be weighed to make sure you and the fetus are growing normally.  Your blood pressure will be taken.  Your abdomen will be measured to track your baby's growth.  The fetal heartbeat will be listened to.  Any test results from the previous visit will be discussed. Your caregiver may ask you:  How you are feeling.  If you are feeling the baby move.  If you have had any abnormal symptoms, such as leaking fluid, bleeding, severe headaches, or abdominal cramping.  If you have any questions. Other tests that may be performed during your second trimester include:  Blood tests that check for:  Low iron levels (anemia).  Gestational diabetes (between 24 and 28 weeks).  Rh antibodies.  Urine tests to check for infections, diabetes, or protein in the urine.  An ultrasound to confirm the proper growth and development of the baby.  An amniocentesis to check for possible genetic problems.  Fetal screens for spina bifida and Down syndrome. HOME CARE INSTRUCTIONS   Avoid all smoking, herbs, alcohol, and unprescribed drugs. These chemicals affect the formation and growth of the baby.  Follow your caregiver's instructions regarding medicine use. There are medicines that are either safe or unsafe to take during pregnancy.  Exercise only as directed by your caregiver. Experiencing uterine cramps is a good sign to stop exercising.  Continue to eat regular,   healthy meals.  Wear a good support bra for breast tenderness.  Do not use hot tubs, steam rooms, or saunas.  Wear your seat belt at all times when driving.  Avoid raw meat, uncooked cheese, cat litter boxes, and soil used by cats. These carry germs that can cause birth defects in the baby.  Take your prenatal vitamins.  Try taking a stool softener (if your caregiver approves) if you develop constipation. Eat more high-fiber foods,  such as fresh vegetables or fruit and whole grains. Drink plenty of fluids to keep your urine clear or pale yellow.  Take warm sitz baths to soothe any pain or discomfort caused by hemorrhoids. Use hemorrhoid cream if your caregiver approves.  If you develop varicose veins, wear support hose. Elevate your feet for 15 minutes, 3 4 times a day. Limit salt in your diet.  Avoid heavy lifting, wear low heel shoes, and practice good posture.  Rest with your legs elevated if you have leg cramps or low back pain.  Visit your dentist if you have not gone yet during your pregnancy. Use a soft toothbrush to brush your teeth and be gentle when you floss.  A sexual relationship may be continued unless your caregiver directs you otherwise.  Continue to go to all your prenatal visits as directed by your caregiver. SEEK MEDICAL CARE IF:   You have dizziness.  You have mild pelvic cramps, pelvic pressure, or nagging pain in the abdominal area.  You have persistent nausea, vomiting, or diarrhea.  You have a bad smelling vaginal discharge.  You have pain with urination. SEEK IMMEDIATE MEDICAL CARE IF:   You have a fever.  You are leaking fluid from your vagina.  You have spotting or bleeding from your vagina.  You have severe abdominal cramping or pain.  You have rapid weight gain or loss.  You have shortness of breath with chest pain.  You notice sudden or extreme swelling of your face, hands, ankles, feet, or legs.  You have not felt your baby move in over an hour.  You have severe headaches that do not go away with medicine.  You have vision changes. Document Released: 05/01/2001 Document Revised: 01/07/2013 Document Reviewed: 07/08/2012 [employer] Patient Information 2014 [employer].  

## 2013-08-29 LAB — CULTURE, OB URINE: Colony Count: >=100000

## 2013-08-31 ENCOUNTER — Other Ambulatory Visit: Payer: Self-pay | Admitting: Family Medicine

## 2013-08-31 ENCOUNTER — Encounter: Payer: Self-pay | Admitting: Family Medicine

## 2013-08-31 ENCOUNTER — Telehealth: Payer: Self-pay | Admitting: General Practice

## 2013-08-31 DIAGNOSIS — B951 Streptococcus, group B, as the cause of diseases classified elsewhere: Secondary | ICD-10-CM | POA: Insufficient documentation

## 2013-08-31 DIAGNOSIS — O2340 Unspecified infection of urinary tract in pregnancy, unspecified trimester: Principal | ICD-10-CM

## 2013-08-31 DIAGNOSIS — O2342 Unspecified infection of urinary tract in pregnancy, second trimester: Secondary | ICD-10-CM

## 2013-08-31 LAB — ALPHA FETOPROTEIN, MATERNAL
AFP: 29.1 IU/mL
Curr Gest Age: 15.3 wks.days
MoM for AFP: 1.11
OPEN SPINA BIFIDA: NEGATIVE
Osb Risk: 1:9200 {titer}

## 2013-08-31 MED ORDER — AMOXICILLIN 500 MG PO CAPS: 500.0000 mg | ORAL_CAPSULE | Freq: Three times a day (TID) | ORAL | Status: DC

## 2013-08-31 NOTE — Telephone Encounter (Signed)
Message copied by [facility], Shaleigh Laubscher L on Mon Aug 31, 2013  1:15 PM ------      Message from: Vale HavenBECK, KELI L      Created: Mon Aug 31, 2013  1:13 PM       gbs in urine. Needs treatment for UTI. amox ok to call in. ------

## 2013-08-31 NOTE — Telephone Encounter (Signed)
Called patient and informed her of results and asked what a good pharmacy would be for her antibiotic. Patient stated [facility] on [address]. Told patient I would send the Rx there and it will be available for pickup in about an hour. Patient verbalized understanding and had no further questions

## 2013-09-14 ENCOUNTER — Encounter: Payer: Self-pay | Admitting: Family Medicine

## 2013-09-14 ENCOUNTER — Encounter: Payer: Self-pay | Admitting: *Deleted

## 2013-09-14 DIAGNOSIS — Z3480 Encounter for supervision of other normal pregnancy, unspecified trimester: Secondary | ICD-10-CM

## 2013-09-14 DIAGNOSIS — B951 Streptococcus, group B, as the cause of diseases classified elsewhere: Secondary | ICD-10-CM

## 2013-09-14 DIAGNOSIS — O34219 Maternal care for unspecified type scar from previous cesarean delivery: Secondary | ICD-10-CM

## 2013-09-15 ENCOUNTER — Other Ambulatory Visit: Payer: Self-pay | Admitting: Family Medicine

## 2013-09-15 ENCOUNTER — Ambulatory Visit (HOSPITAL_COMMUNITY)
Admission: RE | Admit: 2013-09-15 | Discharge: 2013-09-15 | Disposition: A | Discharge status: Home / Self Care | Payer: Medicaid Other | Source: Ambulatory Visit | Attending: Family Medicine | Admitting: Family Medicine

## 2013-09-15 DIAGNOSIS — Z3480 Encounter for supervision of other normal pregnancy, unspecified trimester: Secondary | ICD-10-CM

## 2013-09-15 DIAGNOSIS — O34219 Maternal care for unspecified type scar from previous cesarean delivery: Secondary | ICD-10-CM

## 2013-09-15 DIAGNOSIS — Z3689 Encounter for other specified antenatal screening: Secondary | ICD-10-CM | POA: Insufficient documentation

## 2013-09-17 ENCOUNTER — Encounter: Payer: Self-pay | Admitting: Family Medicine

## 2013-09-24 ENCOUNTER — Ambulatory Visit (INDEPENDENT_AMBULATORY_CARE_PROVIDER_SITE_OTHER): Payer: Medicaid Other | Admitting: Family

## 2013-09-24 VITALS — BP 123/74 | HR 86 | Wt 175.8 lb

## 2013-09-24 DIAGNOSIS — Z3480 Encounter for supervision of other normal pregnancy, unspecified trimester: Secondary | ICD-10-CM

## 2013-09-24 DIAGNOSIS — O34219 Maternal care for unspecified type scar from previous cesarean delivery: Secondary | ICD-10-CM

## 2013-09-24 LAB — POCT URINALYSIS DIP (DEVICE)
Bilirubin Urine: NEGATIVE
Glucose, UA: NEGATIVE mg/dL
HGB URINE DIPSTICK: NEGATIVE
Ketones, ur: NEGATIVE mg/dL
Leukocytes, UA: NEGATIVE
NITRITE: NEGATIVE
PH: 7.5 (ref 5.0–8.0)
Protein, ur: NEGATIVE mg/dL
Specific Gravity, Urine: 1.02 (ref 1.005–1.030)
Urobilinogen, UA: 0.2 mg/dL (ref 0.0–1.0)

## 2013-09-24 MED ORDER — AMOXICILLIN 250 MG/5ML PO SUSR: 500.0000 mg | Freq: Three times a day (TID) | ORAL | Status: DC

## 2013-09-24 NOTE — Progress Notes (Signed)
Pt couldn't tolerate the amoxicillin.

## 2013-09-24 NOTE — Progress Notes (Signed)
Unable to tolerate pill amoxicillin.  RX for liquid Amoxicillin sent to pharmacy.  Reviewed ultrasound (normal).  Signed TOLAC consent.

## 2013-09-30 ENCOUNTER — Telehealth: Payer: Self-pay | Admitting: *Deleted

## 2013-09-30 MED ORDER — CEPHALEXIN 500 MG PO CAPS: 500.0000 mg | ORAL_CAPSULE | Freq: Four times a day (QID) | ORAL | Status: DC

## 2013-09-30 NOTE — Telephone Encounter (Addendum)
Pt left message stating that the amoxicillin liquid which she received has been making her sick and burning her throat.  She wants to know if there is anything she can do or anything else she can take.  *Note: pt was prescribed amoxicillin due to GBS in urine.   Call returned to pt and discussed her concern. She states that with both the pill form of Amoxicillin and the liquid she has the reactions of nausea, vomiting, throat burning and headache. She was able to take 2 days of the pill Rx and then had to stop due to the severity of reaction. After switching to the liquid 3 weeks later, she was able to only take 1 days doses.  I stated that she may have a sensitivity to the medication. She then said that all medication has been having this effect on her including the anti-nausea medications. I informed pt that I will discuss with the provider and call her back.  Pt voiced understanding. I consulted with Dr. Erin FullingHarraway-Smith and then called pt. I informed her that I have sent a Rx to her pharmacy for a different antibiotic.  She should stop taking Amoxicillin. She should take the next antibiotic with food and let us know if she is not able to tolerate at least most of the doses.  Pt voiced understanding.

## 2013-10-14 ENCOUNTER — Encounter: Payer: Self-pay | Admitting: *Deleted

## 2013-10-21 ENCOUNTER — Ambulatory Visit (INDEPENDENT_AMBULATORY_CARE_PROVIDER_SITE_OTHER): Payer: Medicaid Other | Admitting: Advanced Practice Midwife

## 2013-10-21 VITALS — BP 109/68 | HR 82 | Temp 97.0°F | Wt 184.7 lb

## 2013-10-21 DIAGNOSIS — Z3480 Encounter for supervision of other normal pregnancy, unspecified trimester: Secondary | ICD-10-CM

## 2013-10-21 LAB — POCT URINALYSIS DIP (DEVICE)
Bilirubin Urine: NEGATIVE
GLUCOSE, UA: NEGATIVE mg/dL
Hgb urine dipstick: NEGATIVE
KETONES UR: NEGATIVE mg/dL
Nitrite: NEGATIVE
PROTEIN: NEGATIVE mg/dL
SPECIFIC GRAVITY, URINE: 1.02 (ref 1.005–1.030)
Urobilinogen, UA: 0.2 mg/dL (ref 0.0–1.0)
pH: 7 (ref 5.0–8.0)

## 2013-10-21 MED ORDER — RANITIDINE HCL 150 MG PO TABS: 150.0000 mg | ORAL_TABLET | Freq: Two times a day (BID) | ORAL | Status: DC

## 2013-10-21 MED ORDER — DOXYLAMINE-PYRIDOXINE 10-10 MG PO TBEC: DELAYED_RELEASE_TABLET | ORAL | Status: DC

## 2013-10-21 NOTE — Progress Notes (Signed)
Diclegis prior approval form filled out and faxed. Information sheet given to patient. Patient informed to call clinic if she does not hear from her pharmacy in a week regarding Diclegis precription.

## 2013-10-21 NOTE — Progress Notes (Signed)
Pt cannot tolerate any medication or food.

## 2013-10-21 NOTE — Progress Notes (Signed)
Doing well.  Good fetal movement, denies vaginal bleeding, LOF, regular contractions.  Daily nausea/vomiting and heartburn.  Start Diclegis and Zantac. Discussed weight gain of 30 lbs in this pregnancy.  Gained 70 lbs last pregnancy.  Reviewed healthy diet.  Plan to work on nausea so pt can eat healthier foods and exercise.  Will continue to monitor.  Unable to give urine sample at time of visit, pt reports some dysuria.  She was unable to keep down abx for last UTI.  Will send for culture.

## 2013-10-25 LAB — CULTURE, OB URINE: Colony Count: 50000

## 2013-11-18 ENCOUNTER — Ambulatory Visit (INDEPENDENT_AMBULATORY_CARE_PROVIDER_SITE_OTHER): Payer: Medicaid Other | Admitting: Advanced Practice Midwife

## 2013-11-18 VITALS — BP 104/70 | HR 85 | Temp 97.1°F | Wt 191.3 lb

## 2013-11-18 DIAGNOSIS — O26899 Other specified pregnancy related conditions, unspecified trimester: Secondary | ICD-10-CM

## 2013-11-18 DIAGNOSIS — Z3492 Encounter for supervision of normal pregnancy, unspecified, second trimester: Secondary | ICD-10-CM

## 2013-11-18 DIAGNOSIS — Z23 Encounter for immunization: Secondary | ICD-10-CM

## 2013-11-18 DIAGNOSIS — R109 Unspecified abdominal pain: Secondary | ICD-10-CM

## 2013-11-18 DIAGNOSIS — Z3480 Encounter for supervision of other normal pregnancy, unspecified trimester: Secondary | ICD-10-CM

## 2013-11-18 LAB — CBC
HEMATOCRIT: 32.6 % — AB (ref 36.0–46.0)
HEMOGLOBIN: 11.1 g/dL — AB (ref 12.0–15.0)
MCH: 30.2 pg (ref 26.0–34.0)
MCHC: 34 g/dL (ref 30.0–36.0)
MCV: 88.8 fL (ref 78.0–100.0)
PLATELETS: 272 10*3/uL (ref 150–400)
RBC: 3.67 MIL/uL — AB (ref 3.87–5.11)
RDW: 13.9 % (ref 11.5–15.5)
WBC: 11.4 10*3/uL — AB (ref 4.0–10.5)

## 2013-11-18 LAB — POCT URINALYSIS DIP (DEVICE)
BILIRUBIN URINE: NEGATIVE
GLUCOSE, UA: NEGATIVE mg/dL
HGB URINE DIPSTICK: NEGATIVE
Ketones, ur: NEGATIVE mg/dL
NITRITE: NEGATIVE
PH: 7 (ref 5.0–8.0)
Protein, ur: NEGATIVE mg/dL
SPECIFIC GRAVITY, URINE: 1.02 (ref 1.005–1.030)
Urobilinogen, UA: 0.2 mg/dL (ref 0.0–1.0)

## 2013-11-18 LAB — OB RESULTS CONSOLE GBS: STREP GROUP B AG: POSITIVE

## 2013-11-18 MED ORDER — TETANUS-DIPHTH-ACELL PERTUSSIS 5-2.5-18.5 LF-MCG/0.5 IM SUSP
0.5000 mL | Freq: Once | INTRAMUSCULAR | Status: AC
Start: 2013-11-18 — End: 2013-11-18
  Administered 2013-11-18: 0.5 mL via INTRAMUSCULAR

## 2013-11-18 NOTE — Progress Notes (Signed)
28 week labs 

## 2013-11-18 NOTE — Progress Notes (Signed)
Doing well.  Good fetal movement, denies vaginal bleeding, LOF, regular contractions.  Did have some abdominal cramping a few days ago, severe enough she almost came to hospital but resolved.  Urine sent for culture. Pt less nauseous, eating more healthy foods now.  Discussed weight gain. Pt gained 70 lbs in last pregnancy.  Continue healthy diet and exercise. Glucose screen today.

## 2013-11-18 NOTE — Progress Notes (Signed)
Tubal papers signed today.  

## 2013-11-19 LAB — HIV ANTIBODY (ROUTINE TESTING W REFLEX): HIV: NONREACTIVE

## 2013-11-19 LAB — GLUCOSE TOLERANCE, 1 HOUR (50G) W/O FASTING: Glucose, 1 Hour GTT: 80 mg/dL (ref 70–140)

## 2013-11-19 LAB — RPR

## 2013-11-20 LAB — CULTURE, OB URINE: Colony Count: 5000

## 2013-12-02 ENCOUNTER — Encounter: Payer: Self-pay | Admitting: *Deleted

## 2013-12-04 ENCOUNTER — Encounter: Payer: Self-pay | Admitting: Family Medicine

## 2013-12-04 ENCOUNTER — Ambulatory Visit (INDEPENDENT_AMBULATORY_CARE_PROVIDER_SITE_OTHER): Payer: Medicaid Other | Admitting: Family Medicine

## 2013-12-04 VITALS — BP 125/77 | HR 93 | Temp 98.1°F | Wt 197.4 lb

## 2013-12-04 DIAGNOSIS — Z3480 Encounter for supervision of other normal pregnancy, unspecified trimester: Secondary | ICD-10-CM

## 2013-12-04 DIAGNOSIS — Z3483 Encounter for supervision of other normal pregnancy, third trimester: Secondary | ICD-10-CM

## 2013-12-04 LAB — POCT URINALYSIS DIP (DEVICE)
Bilirubin Urine: NEGATIVE
Glucose, UA: NEGATIVE mg/dL
HGB URINE DIPSTICK: NEGATIVE
Ketones, ur: NEGATIVE mg/dL
NITRITE: NEGATIVE
PH: 6.5 (ref 5.0–8.0)
Protein, ur: NEGATIVE mg/dL
Specific Gravity, Urine: 1.02 (ref 1.005–1.030)
UROBILINOGEN UA: 0.2 mg/dL (ref 0.0–1.0)

## 2013-12-04 NOTE — Progress Notes (Signed)
Edema-hands/feet  Educated pt on use of prenatal yoga for stretching of ligaments

## 2013-12-04 NOTE — Progress Notes (Signed)
Sandra Meyer is a 33 y.o. G3P1011 at 8523w4d for routine follow up.  She reports round ligament pain bilaterally, +FM, small number of contractions lower abdomen. See flow sheet for details.  A/P: Pregnancy at 7623w4d.  Doing well.   Pregnancy issues include GBS+  Infant feeding choice: breast Contraception choice: BTL, papers signed 09/24/13 Infant circumcision desired yes  Tdap given 11/18/13. 1 hour glucola, CBC, RPR, and HIV were done 7/1.   RH status was reviewed and pt does not need Rhogam.  Rhogam was not given today.   Childbirth and education classes were offered. Preterm labor precautions reviewed. Kick counts reviewed. Follow up 2 weeks.

## 2013-12-04 NOTE — Patient Instructions (Signed)
Third Trimester of Pregnancy The third trimester is from week 29 through week 42, months 7 through 9. The third trimester is a time when the fetus is growing rapidly. At the end of the ninth month, the fetus is about 20 inches in length and weighs 6-10 pounds.  BODY CHANGES Your body goes through many changes during pregnancy. The changes vary from woman to woman.   Your weight will continue to increase. You can expect to gain 25-35 pounds (11-16 kg) by the end of the pregnancy.  You may begin to get stretch marks on your hips, abdomen, and breasts.  You may urinate more often because the fetus is moving lower into your pelvis and pressing on your bladder.  You may develop or continue to have heartburn as a result of your pregnancy.  You may develop constipation because certain hormones are causing the muscles that push waste through your intestines to slow down.  You may develop hemorrhoids or swollen, bulging veins (varicose veins).  You may have pelvic pain because of the weight gain and pregnancy hormones relaxing your joints between the bones in your pelvis. Backaches may result from overexertion of the muscles supporting your posture.  You may have changes in your hair. These can include thickening of your hair, rapid growth, and changes in texture. Some women also have hair loss during or after pregnancy, or hair that feels dry or thin. Your hair will most likely return to normal after your baby is born.  Your breasts will continue to grow and be tender. A yellow discharge may leak from your breasts called colostrum.  Your belly button may stick out.  You may feel short of breath because of your expanding uterus.  You may notice the fetus "dropping," or moving lower in your abdomen.  You may have a bloody mucus discharge. This usually occurs a few days to a week before labor begins.  Your cervix becomes thin and soft (effaced) near your due date. WHAT TO EXPECT AT YOUR PRENATAL  EXAMS  You will have prenatal exams every 2 weeks until week 36. Then, you will have weekly prenatal exams. During a routine prenatal visit:  You will be weighed to make sure you and the fetus are growing normally.  Your blood pressure is taken.  Your abdomen will be measured to track your baby's growth.  The fetal heartbeat will be listened to.  Any test results from the previous visit will be discussed.  You may have a cervical check near your due date to see if you have effaced. At around 36 weeks, your caregiver will check your cervix. At the same time, your caregiver will also perform a test on the secretions of the vaginal tissue. This test is to determine if a type of bacteria, Group B streptococcus, is present. Your caregiver will explain this further. Your caregiver may ask you:  What your birth plan is.  How you are feeling.  If you are feeling the baby move.  If you have had any abnormal symptoms, such as leaking fluid, bleeding, severe headaches, or abdominal cramping.  If you have any questions. Other tests or screenings that may be performed during your third trimester include:  Blood tests that check for low iron levels (anemia).  Fetal testing to check the health, activity level, and growth of the fetus. Testing is done if you have certain medical conditions or if there are problems during the pregnancy. FALSE LABOR You may feel small, irregular contractions that   eventually go away. These are called Braxton Hicks contractions, or false labor. Contractions may last for hours, days, or even weeks before true labor sets in. If contractions come at regular intervals, intensify, or become painful, it is best to be seen by your caregiver.  SIGNS OF LABOR   Menstrual-like cramps.  Contractions that are 5 minutes apart or less.  Contractions that start on the top of the uterus and spread down to the lower abdomen and back.  A sense of increased pelvic pressure or back  pain.  A watery or bloody mucus discharge that comes from the vagina. If you have any of these signs before the 37th week of pregnancy, call your caregiver right away. You need to go to the hospital to get checked immediately. HOME CARE INSTRUCTIONS   Avoid all smoking, herbs, alcohol, and unprescribed drugs. These chemicals affect the formation and growth of the baby.  Follow your caregiver's instructions regarding medicine use. There are medicines that are either safe or unsafe to take during pregnancy.  Exercise only as directed by your caregiver. Experiencing uterine cramps is a good sign to stop exercising.  Continue to eat regular, healthy meals.  Wear a good support bra for breast tenderness.  Do not use hot tubs, steam rooms, or saunas.  Wear your seat belt at all times when driving.  Avoid raw meat, uncooked cheese, cat litter boxes, and soil used by cats. These carry germs that can cause birth defects in the baby.  Take your prenatal vitamins.  Try taking a stool softener (if your caregiver approves) if you develop constipation. Eat more high-fiber foods, such as fresh vegetables or fruit and whole grains. Drink plenty of fluids to keep your urine clear or pale yellow.  Take warm sitz baths to soothe any pain or discomfort caused by hemorrhoids. Use hemorrhoid cream if your caregiver approves.  If you develop varicose veins, wear support hose. Elevate your feet for 15 minutes, 3-4 times a day. Limit salt in your diet.  Avoid heavy lifting, wear low heal shoes, and practice good posture.  Rest a lot with your legs elevated if you have leg cramps or low back pain.  Visit your dentist if you have not gone during your pregnancy. Use a soft toothbrush to brush your teeth and be gentle when you floss.  A sexual relationship may be continued unless your caregiver directs you otherwise.  Do not travel far distances unless it is absolutely necessary and only with the approval  of your caregiver.  Take prenatal classes to understand, practice, and ask questions about the labor and delivery.  Make a trial run to the hospital.  Pack your hospital bag.  Prepare the baby's nursery.  Continue to go to all your prenatal visits as directed by your caregiver. SEEK MEDICAL CARE IF:  You are unsure if you are in labor or if your water has broken.  You have dizziness.  You have mild pelvic cramps, pelvic pressure, or nagging pain in your abdominal area.  You have persistent nausea, vomiting, or diarrhea.  You have a bad smelling vaginal discharge.  You have pain with urination. SEEK IMMEDIATE MEDICAL CARE IF:   You have a fever.  You are leaking fluid from your vagina.  You have spotting or bleeding from your vagina.  You have severe abdominal cramping or pain.  You have rapid weight loss or gain.  You have shortness of breath with chest pain.  You notice sudden or extreme swelling   of your face, hands, ankles, feet, or legs.  You have not felt your baby move in over an hour.  You have severe headaches that do not go away with medicine.  You have vision changes. Document Released: 05/01/2001 Document Revised: 05/12/2013 Document Reviewed: 07/08/2012 [employer] Patient Information 2015 [employer]. This information is not intended to replace advice given to you by your health care provider. Make sure you discuss any questions you have with your health care provider.  

## 2013-12-04 NOTE — Progress Notes (Signed)
I have reviewed and agree with documentation by the resident. BTL papers scanned in chart and signed on 8/1.  F/u in 2 weeks.

## 2013-12-22 ENCOUNTER — Ambulatory Visit (INDEPENDENT_AMBULATORY_CARE_PROVIDER_SITE_OTHER): Payer: Medicaid Other | Admitting: Obstetrics and Gynecology

## 2013-12-22 VITALS — BP 125/73 | HR 85 | Temp 98.1°F | Wt 198.4 lb

## 2013-12-22 DIAGNOSIS — O34219 Maternal care for unspecified type scar from previous cesarean delivery: Secondary | ICD-10-CM

## 2013-12-22 LAB — POCT URINALYSIS DIP (DEVICE)
Bilirubin Urine: NEGATIVE
Glucose, UA: NEGATIVE mg/dL
Hgb urine dipstick: NEGATIVE
Ketones, ur: NEGATIVE mg/dL
NITRITE: NEGATIVE
PH: 7 (ref 5.0–8.0)
PROTEIN: NEGATIVE mg/dL
Specific Gravity, Urine: 1.015 (ref 1.005–1.030)
Urobilinogen, UA: 0.2 mg/dL (ref 0.0–1.0)

## 2013-12-22 NOTE — Progress Notes (Signed)
Edema in hands and feet.  Reports intermittent lower abdominal cramping.

## 2013-12-22 NOTE — Patient Instructions (Signed)
Vaginal Birth After Cesarean Delivery Vaginal birth after cesarean delivery (VBAC) is giving birth vaginally after previously delivering a baby by a cesarean. In the past, if a woman had a cesarean delivery, all births afterward would be done by cesarean delivery. This is no longer true. It can be safe for the mother to try a vaginal delivery after having a cesarean delivery.  It is important to discuss VBAC with your health care provider early in the pregnancy so you can understand the risks, benefits, and options. It will give you time to decide what is best in your particular case. The final decision about whether to have a VBAC or repeat cesarean delivery should be between you and your health care provider. Any changes in your health or your baby's health during your pregnancy may make it necessary to change your initial decision about VBAC.  WOMEN WHO PLAN TO HAVE A VBAC SHOULD CHECK WITH THEIR HEALTH CARE PROVIDER TO BE SURE THAT:  The previous cesarean delivery was done with a low transverse uterine cut (incision) (not a vertical classical incision).   The birth canal is big enough for the baby.   There were no other operations on the uterus.   An electronic fetal monitor (EFM) will be on at all times during labor.   An operating room will be available and ready in case an emergency cesarean delivery is needed.   A health care provider and surgical nursing staff will be available at all times during labor to be ready to do an emergency delivery cesarean if necessary.   An anesthesiologist will be present in case an emergency cesarean delivery is needed.   The nursery is prepared and has adequate personnel and necessary equipment available to care for the baby in case of an emergency cesarean delivery. BENEFITS OF VBAC  Shorter stay in the hospital.   Avoidance of risks associated with cesarean delivery, such as:  Surgical complications, such as opening of the incision or  hernia in the incision.  Injury to other organs.  Fever. This can occur if an infection develops after surgery. It can also occur as a reaction to the medicine given to make you numb during the surgery.  Less blood loss and need for blood transfusions.  Lower risk of blood clots and infection.  Shorter recovery.   Decreased risk for having to remove the uterus (hysterectomy).   Decreased risk for the placenta to completely or partially cover the opening of the uterus (placenta previa) with a future pregnancy.   Decrease risk in future labor and delivery. RISKS OF A VBAC  Tearing (rupture) of the uterus. This is occurs in less than 1% of VBACs. The risk of this happening is higher if:  Steps are taken to begin the labor process (induce labor) or stimulate or strengthen contractions (augment labor).   Medicine is used to soften (ripen) the cervix.  Having to remove the uterus (hysterectomy) if it ruptures. VBAC SHOULD NOT BE DONE IF:  The previous cesarean delivery was done with a vertical (classical) or T-shaped incision or you do not know what kind of incision was made.   You had a ruptured uterus.   You have had certain types of surgery on your uterus, such as removal of uterine fibroids. Ask your health care provider about other types of surgeries that prevent you from having a VBAC.  You have certain medical or childbirth (obstetrical) problems.   There are problems with the baby.   You   have had two previous cesarean deliveries and no vaginal deliveries. OTHER FACTS TO KNOW ABOUT VBAC:  It is safe to have an epidural anesthetic with VBAC.   It is safe to turn the baby from a breech position (attempt an external cephalic version).   It is safe to try a VBAC with twins.   VBAC may not be successful if your baby weights 8.8 lb (4 kg) or more. However, weight predictions are not always accurate and should not be used alone to decide if VBAC is right for  you.  There is an increased failure rate if the time between the cesarean delivery and VBAC is less than 19 months.   Your health care provider may advise against a VBAC if you have preeclampsia (high blood pressure, protein in the urine, and swelling of face and extremities).   VBAC is often successful if you previously gave birth vaginally.   VBAC is often successful when the labor starts spontaneously before the due date.   Delivering a baby through a VBAC is similar to having a normal spontaneous vaginal delivery. Document Released: 10/28/2006 Document Revised: 09/21/2013 Document Reviewed: 12/04/2012 [employer] Patient Information 2015 [employer]. This information is not intended to replace advice given to you by your health care provider. Make sure you discuss any questions you have with your health care provider.  

## 2013-12-22 NOTE — Progress Notes (Signed)
Still wants TOLAC and PPS. PC/S for FTP, max dilation 1cm and BW 6+lbs> good candidate.

## 2014-01-13 ENCOUNTER — Ambulatory Visit (INDEPENDENT_AMBULATORY_CARE_PROVIDER_SITE_OTHER): Payer: Medicaid Other | Admitting: Advanced Practice Midwife

## 2014-01-13 VITALS — BP 119/73 | HR 86 | Temp 97.6°F | Wt 200.8 lb

## 2014-01-13 DIAGNOSIS — Z3493 Encounter for supervision of normal pregnancy, unspecified, third trimester: Secondary | ICD-10-CM

## 2014-01-13 DIAGNOSIS — N390 Urinary tract infection, site not specified: Secondary | ICD-10-CM

## 2014-01-13 DIAGNOSIS — Z3480 Encounter for supervision of other normal pregnancy, unspecified trimester: Secondary | ICD-10-CM

## 2014-01-13 DIAGNOSIS — O2390 Unspecified genitourinary tract infection in pregnancy, unspecified trimester: Secondary | ICD-10-CM

## 2014-01-13 DIAGNOSIS — Z3483 Encounter for supervision of other normal pregnancy, third trimester: Secondary | ICD-10-CM

## 2014-01-13 DIAGNOSIS — B951 Streptococcus, group B, as the cause of diseases classified elsewhere: Secondary | ICD-10-CM

## 2014-01-13 DIAGNOSIS — O2342 Unspecified infection of urinary tract in pregnancy, second trimester: Secondary | ICD-10-CM

## 2014-01-13 LAB — POCT URINALYSIS DIP (DEVICE)
Bilirubin Urine: NEGATIVE
Glucose, UA: NEGATIVE mg/dL
Hgb urine dipstick: NEGATIVE
LEUKOCYTES UA: NEGATIVE
Nitrite: NEGATIVE
PROTEIN: NEGATIVE mg/dL
Specific Gravity, Urine: 1.02 (ref 1.005–1.030)
Urobilinogen, UA: 0.2 mg/dL (ref 0.0–1.0)
pH: 6.5 (ref 5.0–8.0)

## 2014-01-13 NOTE — Patient Instructions (Signed)
Braxton Hicks Contractions Contractions of the uterus can occur throughout pregnancy. Contractions are not always a sign that you are in labor.  WHAT ARE BRAXTON HICKS CONTRACTIONS?  Contractions that occur before labor are called Braxton Hicks contractions, or false labor. Toward the end of pregnancy (32-34 weeks), these contractions can develop more often and may become more forceful. This is not true labor because these contractions do not result in opening (dilatation) and thinning of the cervix. They are sometimes difficult to tell apart from true labor because these contractions can be forceful and people have different pain tolerances. You should not feel embarrassed if you go to the hospital with false labor. Sometimes, the only way to tell if you are in true labor is for your health care provider to look for changes in the cervix. If there are no prenatal problems or other health problems associated with the pregnancy, it is completely safe to be sent home with false labor and await the onset of true labor. HOW CAN YOU TELL THE DIFFERENCE BETWEEN TRUE AND FALSE LABOR? False Labor  The contractions of false labor are usually shorter and not as hard as those of true labor.   The contractions are usually irregular.   The contractions are often felt in the front of the lower abdomen and in the groin.   The contractions may go away when you walk around or change positions while lying down.   The contractions get weaker and are shorter lasting as time goes on.   The contractions do not usually become progressively stronger, regular, and closer together as with true labor.  True Labor  Contractions in true labor last 30-70 seconds, become very regular, usually become more intense, and increase in frequency.   The contractions do not go away with walking.   The discomfort is usually felt in the top of the uterus and spreads to the lower abdomen and low back.   True labor can be  determined by your health care provider with an exam. This will show that the cervix is dilating and getting thinner.  WHAT TO REMEMBER  Keep up with your usual exercises and follow other instructions given by your health care provider.   Take medicines as directed by your health care provider.   Keep your regular prenatal appointments.   Eat and drink lightly if you think you are going into labor.   If Braxton Hicks contractions are making you uncomfortable:   Change your position from lying down or resting to walking, or from walking to resting.   Sit and rest in a tub of warm water.   Drink 2-3 glasses of water. Dehydration may cause these contractions.   Do slow and deep breathing several times an hour.  WHEN SHOULD I SEEK IMMEDIATE MEDICAL CARE? Seek immediate medical care if:  Your contractions become stronger, more regular, and closer together.   You have fluid leaking or gushing from your vagina.   You have a fever.   You pass blood-tinged mucus.   You have vaginal bleeding.   You have continuous abdominal pain.   You have low back pain that you never had before.   You feel your baby's head pushing down and causing pelvic pressure.   Your baby is not moving as much as it used to.  Document Released: 05/07/2005 Document Revised: 05/12/2013 Document Reviewed: 02/16/2013 Exit[employer] Patient Information 2015 [employer]. This information is not intended to replace advice given to you by your health care   provider. Make sure you discuss any questions you have with your health care provider.  Fetal Movement Counts Patient Name: __________________________________________________ Patient Due Date: ____________________ Performing a fetal movement count is highly recommended in high-risk pregnancies, but it is good for every pregnant woman to do. Your health care provider may ask you to start counting fetal movements at 28 weeks of the pregnancy. Fetal  movements often increase:  After eating a full meal.  After physical activity.  After eating or drinking something sweet or cold.  At rest. Pay attention to when you feel the baby is most active. This will help you notice a pattern of your baby's sleep and wake cycles and what factors contribute to an increase in fetal movement. It is important to perform a fetal movement count at the same time each day when your baby is normally most active.  HOW TO COUNT FETAL MOVEMENTS 1. Find a quiet and comfortable area to sit or lie down on your left side. Lying on your left side provides the best blood and oxygen circulation to your baby. 2. Write down the day and time on a sheet of paper or in a journal. 3. Start counting kicks, flutters, swishes, rolls, or jabs in a 2-hour period. You should feel at least 10 movements within 2 hours. 4. If you do not feel 10 movements in 2 hours, wait 2-3 hours and count again. Look for a change in the pattern or not enough counts in 2 hours. SEEK MEDICAL CARE IF:  You feel less than 10 counts in 2 hours, tried twice.  There is no movement in over an hour.  The pattern is changing or taking longer each day to reach 10 counts in 2 hours.  You feel the baby is not moving as he or she usually does. Date: ____________ Movements: ____________ Start time: ____________ Finish time: ____________  Date: ____________ Movements: ____________ Start time: ____________ Finish time: ____________ Date: ____________ Movements: ____________ Start time: ____________ Finish time: ____________ Date: ____________ Movements: ____________ Start time: ____________ Finish time: ____________ Date: ____________ Movements: ____________ Start time: ____________ Finish time: ____________ Date: ____________ Movements: ____________ Start time: ____________ Finish time: ____________ Date: ____________ Movements: ____________ Start time: ____________ Finish time: ____________ Date: ____________  Movements: ____________ Start time: ____________ Finish time: ____________  Date: ____________ Movements: ____________ Start time: ____________ Finish time: ____________ Date: ____________ Movements: ____________ Start time: ____________ Finish time: ____________ Date: ____________ Movements: ____________ Start time: ____________ Finish time: ____________ Date: ____________ Movements: ____________ Start time: ____________ Finish time: ____________ Date: ____________ Movements: ____________ Start time: ____________ Finish time: ____________ Date: ____________ Movements: ____________ Start time: ____________ Finish time: ____________ Date: ____________ Movements: ____________ Start time: ____________ Finish time: ____________  Date: ____________ Movements: ____________ Start time: ____________ Finish time: ____________ Date: ____________ Movements: ____________ Start time: ____________ Finish time: ____________ Date: ____________ Movements: ____________ Start time: ____________ Finish time: ____________ Date: ____________ Movements: ____________ Start time: ____________ Finish time: ____________ Date: ____________ Movements: ____________ Start time: ____________ Finish time: ____________ Date: ____________ Movements: ____________ Start time: ____________ Finish time: ____________ Date: ____________ Movements: ____________ Start time: ____________ Finish time: ____________  Date: ____________ Movements: ____________ Start time: ____________ Finish time: ____________ Date: ____________ Movements: ____________ Start time: ____________ Finish time: ____________ Date: ____________ Movements: ____________ Start time: ____________ Finish time: ____________ Date: ____________ Movements: ____________ Start time: ____________ Finish time: ____________ Date: ____________ Movements: ____________ Start time: ____________ Finish time: ____________ Date: ____________ Movements: ____________ Start time:  ____________ Finish time: ____________ Date: ____________ Movements:   ____________ Start time: ____________ Finish time: ____________  Date: ____________ Movements: ____________ Start time: ____________ Finish time: ____________ Date: ____________ Movements: ____________ Start time: ____________ Finish time: ____________ Date: ____________ Movements: ____________ Start time: ____________ Finish time: ____________ Date: ____________ Movements: ____________ Start time: ____________ Finish time: ____________ Date: ____________ Movements: ____________ Start time: ____________ Finish time: ____________ Date: ____________ Movements: ____________ Start time: ____________ Finish time: ____________ Date: ____________ Movements: ____________ Start time: ____________ Finish time: ____________  Date: ____________ Movements: ____________ Start time: ____________ Finish time: ____________ Date: ____________ Movements: ____________ Start time: ____________ Finish time: ____________ Date: ____________ Movements: ____________ Start time: ____________ Finish time: ____________ Date: ____________ Movements: ____________ Start time: ____________ Finish time: ____________ Date: ____________ Movements: ____________ Start time: ____________ Finish time: ____________ Date: ____________ Movements: ____________ Start time: ____________ Finish time: ____________ Date: ____________ Movements: ____________ Start time: ____________ Finish time: ____________  Date: ____________ Movements: ____________ Start time: ____________ Finish time: ____________ Date: ____________ Movements: ____________ Start time: ____________ Finish time: ____________ Date: ____________ Movements: ____________ Start time: ____________ Finish time: ____________ Date: ____________ Movements: ____________ Start time: ____________ Finish time: ____________ Date: ____________ Movements: ____________ Start time: ____________ Finish time: ____________ Date:  ____________ Movements: ____________ Start time: ____________ Finish time: ____________ Date: ____________ Movements: ____________ Start time: ____________ Finish time: ____________  Date: ____________ Movements: ____________ Start time: ____________ Finish time: ____________ Date: ____________ Movements: ____________ Start time: ____________ Finish time: ____________ Date: ____________ Movements: ____________ Start time: ____________ Finish time: ____________ Date: ____________ Movements: ____________ Start time: ____________ Finish time: ____________ Date: ____________ Movements: ____________ Start time: ____________ Finish time: ____________ Date: ____________ Movements: ____________ Start time: ____________ Finish time: ____________ Document Released: 06/06/2006 Document Revised: 09/21/2013 Document Reviewed: 03/03/2012 Exit[employer] Patient Information 2015 [employer]. This information is not intended to replace advice given to you by your health care provider. Make sure you discuss any questions you have with your health care provider.  

## 2014-01-14 LAB — GC/CHLAMYDIA PROBE AMP
CT PROBE, AMP APTIMA: NEGATIVE
GC Probe RNA: NEGATIVE

## 2014-01-18 NOTE — Progress Notes (Signed)
GBS in urine. PCN allergy. Able to take cephalosporins.

## 2014-01-27 ENCOUNTER — Ambulatory Visit (INDEPENDENT_AMBULATORY_CARE_PROVIDER_SITE_OTHER): Payer: Medicaid Other | Admitting: Obstetrics and Gynecology

## 2014-01-27 ENCOUNTER — Encounter: Payer: Self-pay | Admitting: Obstetrics and Gynecology

## 2014-01-27 VITALS — BP 127/73 | HR 91 | Temp 97.4°F | Wt 204.3 lb

## 2014-01-27 DIAGNOSIS — O34219 Maternal care for unspecified type scar from previous cesarean delivery: Secondary | ICD-10-CM

## 2014-01-27 DIAGNOSIS — O26899 Other specified pregnancy related conditions, unspecified trimester: Secondary | ICD-10-CM

## 2014-01-27 DIAGNOSIS — R102 Pelvic and perineal pain: Secondary | ICD-10-CM

## 2014-01-27 DIAGNOSIS — O9989 Other specified diseases and conditions complicating pregnancy, childbirth and the puerperium: Secondary | ICD-10-CM

## 2014-01-27 DIAGNOSIS — N949 Unspecified condition associated with female genital organs and menstrual cycle: Secondary | ICD-10-CM

## 2014-01-27 NOTE — Progress Notes (Signed)
For TOLAC and PPS. Had PROM and failed IOL with P1, max dilation 1 cm, 6+ #.  Signs/sx labor reviewed. RLP discussed.

## 2014-01-27 NOTE — Patient Instructions (Signed)
Third Trimester of Pregnancy The third trimester is from week 29 through week 42, months 7 through 9. The third trimester is a time when the fetus is growing rapidly. At the end of the ninth month, the fetus is about 20 inches in length and weighs 6-10 pounds.  BODY CHANGES Your body goes through many changes during pregnancy. The changes vary from woman to woman.   Your weight will continue to increase. You can expect to gain 25-35 pounds (11-16 kg) by the end of the pregnancy.  You may begin to get stretch marks on your hips, abdomen, and breasts.  You may urinate more often because the fetus is moving lower into your pelvis and pressing on your bladder.  You may develop or continue to have heartburn as a result of your pregnancy.  You may develop constipation because certain hormones are causing the muscles that push waste through your intestines to slow down.  You may develop hemorrhoids or swollen, bulging veins (varicose veins).  You may have pelvic pain because of the weight gain and pregnancy hormones relaxing your joints between the bones in your pelvis. Backaches may result from overexertion of the muscles supporting your posture.  You may have changes in your hair. These can include thickening of your hair, rapid growth, and changes in texture. Some women also have hair loss during or after pregnancy, or hair that feels dry or thin. Your hair will most likely return to normal after your baby is born.  Your breasts will continue to grow and be tender. A yellow discharge may leak from your breasts called colostrum.  Your belly button may stick out.  You may feel short of breath because of your expanding uterus.  You may notice the fetus "dropping," or moving lower in your abdomen.  You may have a bloody mucus discharge. This usually occurs a few days to a week before labor begins.  Your cervix becomes thin and soft (effaced) near your due date. WHAT TO EXPECT AT YOUR PRENATAL  EXAMS  You will have prenatal exams every 2 weeks until week 36. Then, you will have weekly prenatal exams. During a routine prenatal visit:  You will be weighed to make sure you and the fetus are growing normally.  Your blood pressure is taken.  Your abdomen will be measured to track your baby's growth.  The fetal heartbeat will be listened to.  Any test results from the previous visit will be discussed.  You may have a cervical check near your due date to see if you have effaced. At around 36 weeks, your caregiver will check your cervix. At the same time, your caregiver will also perform a test on the secretions of the vaginal tissue. This test is to determine if a type of bacteria, Group B streptococcus, is present. Your caregiver will explain this further. Your caregiver may ask you:  What your birth plan is.  How you are feeling.  If you are feeling the baby move.  If you have had any abnormal symptoms, such as leaking fluid, bleeding, severe headaches, or abdominal cramping.  If you have any questions. Other tests or screenings that may be performed during your third trimester include:  Blood tests that check for low iron levels (anemia).  Fetal testing to check the health, activity level, and growth of the fetus. Testing is done if you have certain medical conditions or if there are problems during the pregnancy. FALSE LABOR You may feel small, irregular contractions that   eventually go away. These are called Braxton Hicks contractions, or false labor. Contractions may last for hours, days, or even weeks before true labor sets in. If contractions come at regular intervals, intensify, or become painful, it is best to be seen by your caregiver.  SIGNS OF LABOR   Menstrual-like cramps.  Contractions that are 5 minutes apart or less.  Contractions that start on the top of the uterus and spread down to the lower abdomen and back.  A sense of increased pelvic pressure or back  pain.  A watery or bloody mucus discharge that comes from the vagina. If you have any of these signs before the 37th week of pregnancy, call your caregiver right away. You need to go to the hospital to get checked immediately. HOME CARE INSTRUCTIONS   Avoid all smoking, herbs, alcohol, and unprescribed drugs. These chemicals affect the formation and growth of the baby.  Follow your caregiver's instructions regarding medicine use. There are medicines that are either safe or unsafe to take during pregnancy.  Exercise only as directed by your caregiver. Experiencing uterine cramps is a good sign to stop exercising.  Continue to eat regular, healthy meals.  Wear a good support bra for breast tenderness.  Do not use hot tubs, steam rooms, or saunas.  Wear your seat belt at all times when driving.  Avoid raw meat, uncooked cheese, cat litter boxes, and soil used by cats. These carry germs that can cause birth defects in the baby.  Take your prenatal vitamins.  Try taking a stool softener (if your caregiver approves) if you develop constipation. Eat more high-fiber foods, such as fresh vegetables or fruit and whole grains. Drink plenty of fluids to keep your urine clear or pale yellow.  Take warm sitz baths to soothe any pain or discomfort caused by hemorrhoids. Use hemorrhoid cream if your caregiver approves.  If you develop varicose veins, wear support hose. Elevate your feet for 15 minutes, 3-4 times a day. Limit salt in your diet.  Avoid heavy lifting, wear low heal shoes, and practice good posture.  Rest a lot with your legs elevated if you have leg cramps or low back pain.  Visit your dentist if you have not gone during your pregnancy. Use a soft toothbrush to brush your teeth and be gentle when you floss.  A sexual relationship may be continued unless your caregiver directs you otherwise.  Do not travel far distances unless it is absolutely necessary and only with the approval  of your caregiver.  Take prenatal classes to understand, practice, and ask questions about the labor and delivery.  Make a trial run to the hospital.  Pack your hospital bag.  Prepare the baby's nursery.  Continue to go to all your prenatal visits as directed by your caregiver. SEEK MEDICAL CARE IF:  You are unsure if you are in labor or if your water has broken.  You have dizziness.  You have mild pelvic cramps, pelvic pressure, or nagging pain in your abdominal area.  You have persistent nausea, vomiting, or diarrhea.  You have a bad smelling vaginal discharge.  You have pain with urination. SEEK IMMEDIATE MEDICAL CARE IF:   You have a fever.  You are leaking fluid from your vagina.  You have spotting or bleeding from your vagina.  You have severe abdominal cramping or pain.  You have rapid weight loss or gain.  You have shortness of breath with chest pain.  You notice sudden or extreme swelling   of your face, hands, ankles, feet, or legs.  You have not felt your baby move in over an hour.  You have severe headaches that do not go away with medicine.  You have vision changes. Document Released: 05/01/2001 Document Revised: 05/12/2013 Document Reviewed: 07/08/2012 [employer] Patient Information 2015 [employer]. This information is not intended to replace advice given to you by your health care provider. Make sure you discuss any questions you have with your health care provider.  

## 2014-01-27 NOTE — Progress Notes (Signed)
Pain/pressure- vaginal pressure  Edema-hands/feet  Flu vaccine info given and pt will think about  Pt would like to have cervical check

## 2014-02-11 ENCOUNTER — Ambulatory Visit (INDEPENDENT_AMBULATORY_CARE_PROVIDER_SITE_OTHER): Payer: Medicaid Other | Admitting: Advanced Practice Midwife

## 2014-02-11 VITALS — BP 122/74 | HR 87 | Wt 207.5 lb

## 2014-02-11 DIAGNOSIS — O34219 Maternal care for unspecified type scar from previous cesarean delivery: Secondary | ICD-10-CM

## 2014-02-11 DIAGNOSIS — Z3480 Encounter for supervision of other normal pregnancy, unspecified trimester: Secondary | ICD-10-CM

## 2014-02-11 LAB — POCT URINALYSIS DIP (DEVICE)
Bilirubin Urine: NEGATIVE
Glucose, UA: NEGATIVE mg/dL
HGB URINE DIPSTICK: NEGATIVE
KETONES UR: NEGATIVE mg/dL
Nitrite: NEGATIVE
PROTEIN: NEGATIVE mg/dL
SPECIFIC GRAVITY, URINE: 1.015 (ref 1.005–1.030)
UROBILINOGEN UA: 0.2 mg/dL (ref 0.0–1.0)
pH: 7.5 (ref 5.0–8.0)

## 2014-02-11 NOTE — Patient Instructions (Signed)

## 2014-02-11 NOTE — Progress Notes (Signed)
Ready!  Reviewed labor plans. Prefers to avoid epidural if possible. Wants to walk, etc as possible. Reviewed monitoring with TOLAC.  Reviewed hosptal course and discharge (offered early ).  Would like early discharge.

## 2014-02-15 ENCOUNTER — Telehealth: Payer: Self-pay

## 2014-02-15 NOTE — Telephone Encounter (Signed)
Pt called and stated that she she [redacted] weeks pregnant she is having discomfort, no discharge or bleeding, and have not felt the baby move as much.  Called pt and pt informed me that her belly tightens at the top and that she had "this cramping feeling for 2 hours I sat down and then it went away".  I informed pt that it sounds like she is having braxton hicks contractions.  To make sure that monitors the intensity of the contraction and if walking or resting does not relieve them to monitor them. If contractions become closer than 10 minutes apart I advised her to go to the MAU and if her water breaks.  Pt stated understanding with no further questions.

## 2014-02-18 ENCOUNTER — Encounter: Payer: Self-pay | Admitting: Obstetrics and Gynecology

## 2014-02-18 ENCOUNTER — Ambulatory Visit (INDEPENDENT_AMBULATORY_CARE_PROVIDER_SITE_OTHER): Payer: Medicaid Other | Admitting: Obstetrics and Gynecology

## 2014-02-18 VITALS — BP 128/77 | HR 73 | Wt 208.2 lb

## 2014-02-18 DIAGNOSIS — O480 Post-term pregnancy: Secondary | ICD-10-CM

## 2014-02-18 LAB — US OB FOLLOW UP

## 2014-02-18 NOTE — Progress Notes (Signed)
IOL scheduled 10/5 @ 0730

## 2014-02-18 NOTE — Patient Instructions (Signed)

## 2014-02-18 NOTE — Progress Notes (Signed)
Dong well except RLP and LBP> does not want to take Tylenol. Relief measures discussed. Reviewed good dating criteria and OB hx. No labor, ROM[facility] Still wants TOL[facility]. EFW: 7#. If AFI normal will schedule IOL Monday.

## 2014-02-19 ENCOUNTER — Telehealth (HOSPITAL_COMMUNITY): Payer: Self-pay | Admitting: *Deleted

## 2014-02-19 NOTE — Telephone Encounter (Signed)
Preadmission screen  

## 2014-02-20 ENCOUNTER — Encounter (HOSPITAL_COMMUNITY): Payer: Medicaid Other | Admitting: Anesthesiology

## 2014-02-20 ENCOUNTER — Inpatient Hospital Stay (HOSPITAL_COMMUNITY)
Admission: AD | Admit: 2014-02-20 | Discharge: 2014-02-22 | DRG: 767 | Disposition: A | Discharge status: Home / Self Care | Payer: Medicaid Other | Source: Ambulatory Visit | Attending: Family Medicine | Admitting: Family Medicine

## 2014-02-20 ENCOUNTER — Encounter (HOSPITAL_COMMUNITY): Payer: Self-pay | Admitting: *Deleted

## 2014-02-20 ENCOUNTER — Inpatient Hospital Stay (HOSPITAL_COMMUNITY): Payer: Medicaid Other | Admitting: Anesthesiology

## 2014-02-20 DIAGNOSIS — O99824 Streptococcus B carrier state complicating childbirth: Secondary | ICD-10-CM | POA: Diagnosis present

## 2014-02-20 DIAGNOSIS — Z8249 Family history of ischemic heart disease and other diseases of the circulatory system: Secondary | ICD-10-CM

## 2014-02-20 DIAGNOSIS — B951 Streptococcus, group B, as the cause of diseases classified elsewhere: Secondary | ICD-10-CM

## 2014-02-20 DIAGNOSIS — O09523 Supervision of elderly multigravida, third trimester: Secondary | ICD-10-CM | POA: Diagnosis not present

## 2014-02-20 DIAGNOSIS — Z3483 Encounter for supervision of other normal pregnancy, third trimester: Secondary | ICD-10-CM | POA: Diagnosis present

## 2014-02-20 DIAGNOSIS — Z880 Allergy status to penicillin: Secondary | ICD-10-CM | POA: Diagnosis not present

## 2014-02-20 DIAGNOSIS — Z87891 Personal history of nicotine dependence: Secondary | ICD-10-CM

## 2014-02-20 DIAGNOSIS — O480 Post-term pregnancy: Secondary | ICD-10-CM | POA: Diagnosis present

## 2014-02-20 DIAGNOSIS — Z302 Encounter for sterilization: Secondary | ICD-10-CM

## 2014-02-20 DIAGNOSIS — O2342 Unspecified infection of urinary tract in pregnancy, second trimester: Secondary | ICD-10-CM

## 2014-02-20 DIAGNOSIS — O34219 Maternal care for unspecified type scar from previous cesarean delivery: Secondary | ICD-10-CM

## 2014-02-20 DIAGNOSIS — Z3A40 40 weeks gestation of pregnancy: Secondary | ICD-10-CM | POA: Diagnosis present

## 2014-02-20 DIAGNOSIS — O3421 Maternal care for scar from previous cesarean delivery: Secondary | ICD-10-CM

## 2014-02-20 LAB — TYPE AND SCREEN
ABO/RH(D): A POS
Antibody Screen: NEGATIVE

## 2014-02-20 LAB — SURGICAL PCR SCREEN
MRSA, PCR: NEGATIVE
Staphylococcus aureus: NEGATIVE

## 2014-02-20 LAB — RPR

## 2014-02-20 LAB — CBC
HEMATOCRIT: 33.7 % — AB (ref 36.0–46.0)
HEMOGLOBIN: 11.3 g/dL — AB (ref 12.0–15.0)
MCH: 29.7 pg (ref 26.0–34.0)
MCHC: 33.5 g/dL (ref 30.0–36.0)
MCV: 88.7 fL (ref 78.0–100.0)
Platelets: 223 10*3/uL (ref 150–400)
RBC: 3.8 MIL/uL — ABNORMAL LOW (ref 3.87–5.11)
RDW: 14.8 % (ref 11.5–15.5)
WBC: 13.1 10*3/uL — ABNORMAL HIGH (ref 4.0–10.5)

## 2014-02-20 MED ORDER — ONDANSETRON HCL 4 MG PO TABS: 4.0000 mg | ORAL_TABLET | ORAL | Status: DC | PRN

## 2014-02-20 MED ORDER — LANOLIN HYDROUS EX OINT
TOPICAL_OINTMENT | CUTANEOUS | Status: DC | PRN
Start: 2014-02-20 — End: 2014-02-22

## 2014-02-20 MED ORDER — ONDANSETRON HCL 4 MG/2ML IJ SOLN: 4.0000 mg | INTRAMUSCULAR | Status: DC | PRN

## 2014-02-20 MED ORDER — ZOLPIDEM TARTRATE 5 MG PO TABS: 5.0000 mg | ORAL_TABLET | Freq: Every evening | ORAL | Status: DC | PRN

## 2014-02-20 MED ORDER — DIBUCAINE 1 % RE OINT: 1.0000 "application " | TOPICAL_OINTMENT | RECTAL | Status: DC | PRN

## 2014-02-20 MED ORDER — LACTATED RINGERS IV SOLN
500.0000 mL | Freq: Once | INTRAVENOUS | Status: AC
  Administered 2014-02-20: 500 mL via INTRAVENOUS

## 2014-02-20 MED ORDER — OXYTOCIN BOLUS FROM INFUSION: 500.0000 mL | INTRAVENOUS | Status: DC

## 2014-02-20 MED ORDER — DIPHENHYDRAMINE HCL 25 MG PO CAPS
25.0000 mg | ORAL_CAPSULE | Freq: Four times a day (QID) | ORAL | Status: DC | PRN
Start: 2014-02-20 — End: 2014-02-22

## 2014-02-20 MED ORDER — ONDANSETRON HCL 4 MG/2ML IJ SOLN
4.0000 mg | Freq: Four times a day (QID) | INTRAMUSCULAR | Status: DC | PRN
Start: 2014-02-20 — End: 2014-02-20

## 2014-02-20 MED ORDER — LACTATED RINGERS IV SOLN: 500.0000 mL | INTRAVENOUS | Status: DC | PRN

## 2014-02-20 MED ORDER — CITRIC ACID-SODIUM CITRATE 334-500 MG/5ML PO SOLN: 30.0000 mL | ORAL | Status: DC | PRN

## 2014-02-20 MED ORDER — CEFAZOLIN SODIUM 1-5 GM-% IV SOLN
1.0000 g | Freq: Three times a day (TID) | INTRAVENOUS | Status: DC
  Filled 2014-02-20 (×3): qty 50

## 2014-02-20 MED ORDER — OXYCODONE-ACETAMINOPHEN 5-325 MG PO TABS: 1.0000 | ORAL_TABLET | ORAL | Status: DC | PRN

## 2014-02-20 MED ORDER — PHENYLEPHRINE 40 MCG/ML (10ML) SYRINGE FOR IV PUSH (FOR BLOOD PRESSURE SUPPORT)
80.0000 ug | PREFILLED_SYRINGE | INTRAVENOUS | Status: DC | PRN
  Filled 2014-02-20: qty 2
  Filled 2014-02-20: qty 10

## 2014-02-20 MED ORDER — BENZOCAINE-MENTHOL 20-0.5 % EX AERO
1.0000 | INHALATION_SPRAY | CUTANEOUS | Status: DC | PRN
Start: 2014-02-20 — End: 2014-02-22
  Administered 2014-02-20: 1 via TOPICAL
  Filled 2014-02-20: qty 56

## 2014-02-20 MED ORDER — WITCH HAZEL-GLYCERIN EX PADS: 1.0000 "application " | MEDICATED_PAD | CUTANEOUS | Status: DC | PRN

## 2014-02-20 MED ORDER — TETANUS-DIPHTH-ACELL PERTUSSIS 5-2.5-18.5 LF-MCG/0.5 IM SUSP
0.5000 mL | Freq: Once | INTRAMUSCULAR | Status: DC
Start: 2014-02-21 — End: 2014-02-20

## 2014-02-20 MED ORDER — SIMETHICONE 80 MG PO CHEW
80.0000 mg | CHEWABLE_TABLET | ORAL | Status: DC | PRN
  Administered 2014-02-21: 80 mg via ORAL
  Filled 2014-02-20: qty 1

## 2014-02-20 MED ORDER — EPHEDRINE 5 MG/ML INJ
10.0000 mg | INTRAVENOUS | Status: DC | PRN
  Filled 2014-02-20: qty 2

## 2014-02-20 MED ORDER — LACTATED RINGERS IV SOLN
INTRAVENOUS | Status: DC
  Administered 2014-02-20: 11:00:00 via INTRAUTERINE

## 2014-02-20 MED ORDER — FENTANYL CITRATE 0.05 MG/ML IJ SOLN
100.0000 ug | INTRAMUSCULAR | Status: DC | PRN
  Administered 2014-02-20: 100 ug via INTRAVENOUS
  Filled 2014-02-20: qty 2

## 2014-02-20 MED ORDER — PRENATAL MULTIVITAMIN CH
1.0000 | ORAL_TABLET | Freq: Every day | ORAL | Status: DC
  Administered 2014-02-21 – 2014-02-22 (×2): 1 via ORAL
  Filled 2014-02-20 (×2): qty 1

## 2014-02-20 MED ORDER — IBUPROFEN 600 MG PO TABS
600.0000 mg | ORAL_TABLET | Freq: Four times a day (QID) | ORAL | Status: DC
  Administered 2014-02-20 – 2014-02-22 (×8): 600 mg via ORAL
  Filled 2014-02-20 (×8): qty 1

## 2014-02-20 MED ORDER — CEFAZOLIN SODIUM-DEXTROSE 2-3 GM-% IV SOLR
2.0000 g | Freq: Once | INTRAVENOUS | Status: AC
  Administered 2014-02-20: 2 g via INTRAVENOUS
  Filled 2014-02-20: qty 50

## 2014-02-20 MED ORDER — ACETAMINOPHEN 325 MG PO TABS: 650.0000 mg | ORAL_TABLET | ORAL | Status: DC | PRN

## 2014-02-20 MED ORDER — FLEET ENEMA 7-19 GM/118ML RE ENEM
1.0000 | ENEMA | RECTAL | Status: DC | PRN
Start: 2014-02-20 — End: 2014-02-20

## 2014-02-20 MED ORDER — LIDOCAINE HCL (PF) 1 % IJ SOLN
INTRAMUSCULAR | Status: DC | PRN
  Administered 2014-02-20 (×2): 4 mL

## 2014-02-20 MED ORDER — FENTANYL 2.5 MCG/ML BUPIVACAINE 1/10 % EPIDURAL INFUSION (WH - ANES)
14.0000 mL/h | INTRAMUSCULAR | Status: DC | PRN
  Administered 2014-02-20: 14 mL/h via EPIDURAL
  Filled 2014-02-20: qty 125

## 2014-02-20 MED ORDER — LACTATED RINGERS IV SOLN
INTRAVENOUS | Status: DC
  Administered 2014-02-20 (×2): via INTRAVENOUS

## 2014-02-20 MED ORDER — PHENYLEPHRINE 40 MCG/ML (10ML) SYRINGE FOR IV PUSH (FOR BLOOD PRESSURE SUPPORT)
80.0000 ug | PREFILLED_SYRINGE | INTRAVENOUS | Status: DC | PRN
Start: 2014-02-20 — End: 2014-02-20
  Filled 2014-02-20: qty 2

## 2014-02-20 MED ORDER — OXYTOCIN 40 UNITS IN LACTATED RINGERS INFUSION - SIMPLE MED
62.5000 mL/h | INTRAVENOUS | Status: DC
  Filled 2014-02-20: qty 1000

## 2014-02-20 MED ORDER — DIPHENHYDRAMINE HCL 50 MG/ML IJ SOLN: 12.5000 mg | INTRAMUSCULAR | Status: DC | PRN

## 2014-02-20 MED ORDER — SENNOSIDES-DOCUSATE SODIUM 8.6-50 MG PO TABS
2.0000 | ORAL_TABLET | ORAL | Status: DC
  Administered 2014-02-20 – 2014-02-21 (×2): 2 via ORAL
  Filled 2014-02-20 (×2): qty 2

## 2014-02-20 MED ORDER — OXYCODONE-ACETAMINOPHEN 5-325 MG PO TABS: 2.0000 | ORAL_TABLET | ORAL | Status: DC | PRN

## 2014-02-20 MED ORDER — FENTANYL 2.5 MCG/ML BUPIVACAINE 1/10 % EPIDURAL INFUSION (WH - ANES)
INTRAMUSCULAR | Status: DC | PRN
  Administered 2014-02-20: 14 mL/h via EPIDURAL

## 2014-02-20 MED ORDER — FENTANYL 2.5 MCG/ML BUPIVACAINE 1/10 % EPIDURAL INFUSION (WH - ANES): 14.0000 mL/h | INTRAMUSCULAR | Status: DC | PRN

## 2014-02-20 MED ORDER — LIDOCAINE HCL (PF) 1 % IJ SOLN
30.0000 mL | INTRAMUSCULAR | Status: DC | PRN
  Administered 2014-02-20: 30 mL via SUBCUTANEOUS
  Filled 2014-02-20: qty 30

## 2014-02-20 NOTE — Anesthesia Preprocedure Evaluation (Signed)
Anesthesia Evaluation  Patient identified by MRN, date of birth, ID band Patient awake    Reviewed: Allergy & Precautions, H&P , NPO status , Patient's Chart, lab work & pertinent test results  Airway Mallampati: II TM Distance: >3 FB Neck ROM: Full    Dental no notable dental hx.    Pulmonary former smoker,  breath sounds clear to auscultation  Pulmonary exam normal       Cardiovascular negative cardio ROS  Rhythm:Regular Rate:Normal     Neuro/Psych negative neurological ROS  negative psych ROS   GI/Hepatic negative GI ROS, Neg liver ROS,   Endo/Other  negative endocrine ROS  Renal/GU negative Renal ROS     Musculoskeletal negative musculoskeletal ROS (+)   Abdominal   Peds  Hematology  (+) anemia ,   Anesthesia Other Findings   Reproductive/Obstetrics (+) Pregnancy                           Anesthesia Physical Anesthesia Plan  ASA: II  Anesthesia Plan: Epidural   Post-op Pain Management:    Induction:   Airway Management Planned:   Additional Equipment:   Intra-op Plan:   Post-operative Plan:   Informed Consent: I have reviewed the patients History and Physical, chart, labs and discussed the procedure including the risks, benefits and alternatives for the proposed anesthesia with the patient or authorized representative who has indicated his/her understanding and acceptance.     Plan Discussed with:   Anesthesia Plan Comments:         Anesthesia Quick Evaluation

## 2014-02-20 NOTE — Progress Notes (Signed)
Sandra Meyer is a 33 y.o. G3P1011 at 628w5d by LMP admitted for postdates/early labor  Subjective: Uncomfortable, lots of pressure  Objective: BP 117/77  Pulse 73  Temp(Src) 97.7 F (36.5 C) (Oral)  Resp 18  Ht 5' 11.5" (1.816 m)  Wt 94.802 kg (209 lb)  BMI 28.75 kg/m2  LMP 05/11/2013      FHT:  FHR: 130 bpm, variability: moderate,  accelerations:  Present,  decelerations:  Absent UC:   regular, every 2 minutes SVE:   Dilation: 9 Effacement (%): 100 Station: 0 Exam by:: n licato rn and Dr Delford Fieldwright  Labs: Lab Results  Component Value Date   WBC 13.1* 02/20/2014   HGB 11.3* 02/20/2014   HCT 33.7* 02/20/2014   MCV 88.7 02/20/2014   PLT 223 02/20/2014    Assessment / Plan: Spontaneous labor, progressing normally. SROM around 0700, expect delivery shortly  Labor: Progressing normally Preeclampsia:  no signs or symptoms of toxicity Fetal Wellbeing:  Category I Pain Control:  Labor support without medications I/D:  cefazolin started @0632  Anticipated MOD:  NSVD  Tawni CarnesWight, Celester Morgan 02/20/2014, 7:02 AM

## 2014-02-20 NOTE — Consult Note (Addendum)
Neonatology Note:   Attendance at Delivery:    I was asked by Dr. Loreta AveAcosta to attend this VBAC at term [facility] The mother is a G3P1A1 A pos, GBS positive, on IV antibiotics > 4 hours PTD. ROM 4 hours prior to delivery, fluid clear. Infant vigorous with good spontaneous cry and tone. Needed only minimal bulb suctioning. Ap 8/9. Lungs clear to ausc in DR. To CN to care of Pediatrician.   Doretha Souhristie C. Sharena Dibenedetto, MD

## 2014-02-20 NOTE — Progress Notes (Signed)
Reported patient still reporting 10/10 pain after fentanyl. Request SVE. Dr. Waynetta SandyWight ordered RN to do SVE.

## 2014-02-20 NOTE — H&P (Signed)
Sandra Meyer is a 33 y.o. female at [redacted]w[redacted]d presenting for contractions/early labor. History  Contraction pain started around 0100, severe contraction pain primarily in back. She denies any bleeding, gush of fluid. +FM but says baby has not been moving as much since the contractions started. Has not taken anything for the pain, wants to have as natural a birth as possible. Contraction pain much stronger than first pregnancy which ended in CS for FTP. No headache, no RUQ pain, no changes in vision.  Clinic   [facility]    Dating   LMP Ultrasound consistent with LMP: Yes    Genetic Screen   1 Screen: nml AFP: neg    Anatomic [place]   normal    GTT   Early: Third trimester: nl- 80    TDaP vaccine   7/1    Flu vaccine     GBS   Positive (urine initial labs) - PENICILLIN ALLERGY-able to take cephalosporins.    Baby Food   breast    Contraception   Wants BTL   Circumcision   Yes    Pediatrician      OB History   Grav Para Term Preterm Abortions TAB SAB Ect Mult Living   3 1 1  1  1   1     2010 LTCS: Induced because ROM and did go into spontaneous labor, c/s for ftp past 1cm. Born at [facility]   Past Medical History  Diagnosis Date  . Anemia   . AMA (advanced maternal age) multigravida 35+    Past Surgical History  Procedure Laterality Date  . Cesarean section    . Tonsillectomy     Family History: family history includes Cancer in her father; Hypertension in her mother; Varicose Veins in her mother. Social History:  reports that she quit smoking about 15 months ago. Her smoking use included Cigarettes. She smoked 0.00 packs per day. She has never used smokeless tobacco. She reports that she does not drink alcohol or use illicit drugs.   Prenatal Transfer Tool  Maternal Diabetes: No Genetic Screening: Normal Maternal Ultrasounds/Referrals: Normal Fetal Ultrasounds or other Referrals:  None Maternal Substance Abuse:  No Significant Maternal Medications:  None Significant Maternal  Lab Results:  Lab values include: Group B Strep positive Other Comments:  None  Review of Systems  Constitutional: Negative for fever.  Eyes: Negative for blurred vision.  Respiratory: Negative for shortness of breath.   Cardiovascular: Negative for chest pain.  Gastrointestinal: Positive for abdominal pain.  Musculoskeletal: Positive for back pain.  Neurological: Negative for headaches.    Dilation: 1 Effacement (%): 60 Station: -3 Exam by:: Denise Collison,RN Blood pressure 126/76, pulse 78, temperature 97.8 F (36.6 C), temperature source Oral, resp. rate 18, height 5' 11.5" (1.816 m), weight 94.802 kg (209 lb), last menstrual period 05/11/2013. Maternal Exam:  Uterine Assessment: Contraction strength is moderate.  Contraction duration is 1 minute. Contraction frequency is regular.   Abdomen: Surgical scars: low transverse.   Fetal presentation: vertex  Introitus: Ferning test: not done.  Nitrazine test: not done. Amniotic fluid character: not assessed.  Pelvis: adequate for delivery.   Cervix: Cervix evaluated by digital exam.     Fetal Exam Fetal Monitor Review: Mode: ultrasound.   Baseline rate: 150.  Variability: moderate (6-25 bpm).   Pattern: accelerations present and variable decelerations.    Fetal State Assessment: Category II - tracings are indeterminate.     Physical Exam  Constitutional: She is oriented to person, place,  and time. She appears well-developed and well-nourished.  HENT:  Head: Normocephalic and atraumatic.  Cardiovascular: Normal rate, regular rhythm, normal heart sounds and intact distal pulses.   No murmur heard. Respiratory: Effort normal and breath sounds normal. She has no wheezes.  GI: There is tenderness (mild, diffuse).  Neurological: She is alert and oriented to person, place, and time.  Skin: Skin is warm and dry.  Psychiatric: She has a normal mood and affect. Her behavior is normal.    Prenatal labs: ABO, Rh:  A/Positive/-- (03/02 0000) Antibody: Negative (03/02 0000) Rubella: Immune (03/02 0000) RPR: NON REAC (07/01 1103)  HBsAg: Negative (03/02 0000)  HIV: NONREACTIVE (07/01 1103)  GBS: Positive (07/01 0000)   Assessment/Plan: [name] is a 33 y.o. G3P1011 at 2543w5d by LMP here for postdates pregnancy/early labor  #Labor: early, plan on foley bulb #Pain: Declines IV and epidural currently #FWB: Cat II, minimal variability at times, few variable decels #ID:  GBS+, PCN allergy but can use cephalosporins. Cefazolin #MOF: breast #MOC:BTL #Circ:  Yes   Tawni CarnesWight, Andrew 02/20/2014, 3:48 AM  I have seen and examined this patient and I agree with the above. Pt desires VBAC- consent on chart. Also desires BTL- consent on chart. Cam HaiSHAW, Adilene Areola CNM 9:25 AM 02/20/2014

## 2014-02-20 NOTE — MAU Note (Signed)
Pt reports ctx strong for the past 2 hours denise SROM or bleeidng . Good fetal movement.

## 2014-02-20 NOTE — Anesthesia Postprocedure Evaluation (Signed)
  Anesthesia Post-op Note  Patient: Sandra Meyer  Procedure(s) Performed: * No procedures listed *  Patient Location: Mother/Baby  Anesthesia Type:Epidural  Level of Consciousness: awake  Airway and Oxygen Therapy: Patient Spontanous Breathing  Post-op Pain: mild  Post-op Assessment: Patient's Cardiovascular Status Stable and Respiratory Function Stable  Post-op Vital Signs: stable  Last Vitals:  Filed Vitals:   02/20/14 1432  BP: 120/67  Pulse: 77  Temp: 36.8 C  Resp: 17    Complications: No apparent anesthesia complications

## 2014-02-20 NOTE — Anesthesia Procedure Notes (Signed)
Epidural Patient location during procedure: OB  Staffing Anesthesiologist: Quamaine Webb R Performed by: anesthesiologist   Preanesthetic Checklist Completed: patient identified, pre-op evaluation, timeout performed, IV checked, risks and benefits discussed and monitors and equipment checked  Epidural Patient position: sitting Prep: site prepped and draped and DuraPrep Patient monitoring: heart rate Approach: midline Location: L3-L4 Injection technique: LOR air and LOR saline  Needle:  Needle type: Tuohy  Needle gauge: 17 G Needle length: 9 cm Needle insertion depth: 6 cm Catheter type: closed end flexible Catheter size: 19 Gauge Catheter at skin depth: 12 cm Test dose: negative  Assessment Sensory level: T8 Events: blood not aspirated, injection not painful, no injection resistance, negative IV test and no paresthesia  Additional Notes Reason for block:procedure for pain   

## 2014-02-21 ENCOUNTER — Inpatient Hospital Stay (HOSPITAL_COMMUNITY): Payer: Medicaid Other | Admitting: Anesthesiology

## 2014-02-21 ENCOUNTER — Encounter (HOSPITAL_COMMUNITY): Payer: Self-pay

## 2014-02-21 ENCOUNTER — Encounter (HOSPITAL_COMMUNITY): Payer: Medicaid Other | Admitting: Anesthesiology

## 2014-02-21 ENCOUNTER — Encounter (HOSPITAL_COMMUNITY)
Admission: AD | Disposition: A | Discharge status: Home / Self Care | Payer: Self-pay | Source: Ambulatory Visit | Attending: Family Medicine

## 2014-02-21 DIAGNOSIS — Z302 Encounter for sterilization: Secondary | ICD-10-CM

## 2014-02-21 HISTORY — PX: TUBAL LIGATION: SHX77

## 2014-02-21 SURGERY — LIGATION, FALLOPIAN TUBE, POSTPARTUM
Anesthesia: Epidural | Site: Abdomen | Laterality: Bilateral

## 2014-02-21 MED ORDER — ONDANSETRON HCL 4 MG/2ML IJ SOLN
INTRAMUSCULAR | Status: DC | PRN
  Administered 2014-02-21: 4 mg via INTRAVENOUS

## 2014-02-21 MED ORDER — FENTANYL CITRATE 0.05 MG/ML IJ SOLN
INTRAMUSCULAR | Status: AC
  Filled 2014-02-21: qty 2

## 2014-02-21 MED ORDER — SODIUM BICARBONATE 8.4 % IV SOLN
INTRAVENOUS | Status: DC | PRN
  Administered 2014-02-21: 5 mL via EPIDURAL
  Administered 2014-02-21: 7 mL via EPIDURAL
  Administered 2014-02-21: 5 mL via EPIDURAL
  Administered 2014-02-21: 3 mL via EPIDURAL

## 2014-02-21 MED ORDER — ONDANSETRON HCL 4 MG/2ML IJ SOLN
INTRAMUSCULAR | Status: AC
  Filled 2014-02-21: qty 2

## 2014-02-21 MED ORDER — MEPERIDINE HCL 25 MG/ML IJ SOLN: 6.2500 mg | INTRAMUSCULAR | Status: DC | PRN

## 2014-02-21 MED ORDER — MIDAZOLAM HCL 2 MG/2ML IJ SOLN: 0.5000 mg | Freq: Once | INTRAMUSCULAR | Status: DC | PRN

## 2014-02-21 MED ORDER — MIDAZOLAM HCL 2 MG/2ML IJ SOLN
INTRAMUSCULAR | Status: DC | PRN
  Administered 2014-02-21 (×2): 1 mg via INTRAVENOUS

## 2014-02-21 MED ORDER — LACTATED RINGERS IV SOLN
INTRAVENOUS | Status: DC | PRN
  Administered 2014-02-21 (×2): via INTRAVENOUS

## 2014-02-21 MED ORDER — FAMOTIDINE 20 MG PO TABS
40.0000 mg | ORAL_TABLET | Freq: Once | ORAL | Status: AC
  Administered 2014-02-21: 40 mg via ORAL
  Filled 2014-02-21: qty 2

## 2014-02-21 MED ORDER — LIDOCAINE-EPINEPHRINE (PF) 2 %-1:200000 IJ SOLN
INTRAMUSCULAR | Status: AC
  Filled 2014-02-21: qty 20

## 2014-02-21 MED ORDER — KETOROLAC TROMETHAMINE 30 MG/ML IJ SOLN: 15.0000 mg | Freq: Once | INTRAMUSCULAR | Status: DC | PRN

## 2014-02-21 MED ORDER — PROMETHAZINE HCL 25 MG/ML IJ SOLN: 6.2500 mg | INTRAMUSCULAR | Status: DC | PRN

## 2014-02-21 MED ORDER — BUPIVACAINE HCL (PF) 0.25 % IJ SOLN
INTRAMUSCULAR | Status: DC | PRN
  Administered 2014-02-21: 10 mL

## 2014-02-21 MED ORDER — BUPIVACAINE HCL (PF) 0.25 % IJ SOLN
INTRAMUSCULAR | Status: AC
  Filled 2014-02-21: qty 30

## 2014-02-21 MED ORDER — FENTANYL CITRATE 0.05 MG/ML IJ SOLN
INTRAMUSCULAR | Status: DC | PRN
  Administered 2014-02-21 (×2): 50 ug via INTRAVENOUS

## 2014-02-21 MED ORDER — FENTANYL CITRATE 0.05 MG/ML IJ SOLN: 25.0000 ug | INTRAMUSCULAR | Status: DC | PRN

## 2014-02-21 MED ORDER — LANOLIN HYDROUS EX OINT: TOPICAL_OINTMENT | CUTANEOUS | Status: DC | PRN

## 2014-02-21 MED ORDER — SODIUM BICARBONATE 8.4 % IV SOLN
INTRAVENOUS | Status: AC
  Filled 2014-02-21: qty 50

## 2014-02-21 MED ORDER — LACTATED RINGERS IV SOLN
INTRAVENOUS | Status: DC
  Administered 2014-02-21: 20 mL/h via INTRAVENOUS

## 2014-02-21 MED ORDER — MIDAZOLAM HCL 2 MG/2ML IJ SOLN
INTRAMUSCULAR | Status: AC
  Filled 2014-02-21: qty 2

## 2014-02-21 MED ORDER — METOCLOPRAMIDE HCL 10 MG PO TABS
10.0000 mg | ORAL_TABLET | Freq: Once | ORAL | Status: AC
  Administered 2014-02-21: 10 mg via ORAL
  Filled 2014-02-21: qty 1

## 2014-02-21 MED ORDER — TRAMADOL HCL 50 MG PO TABS
50.0000 mg | ORAL_TABLET | Freq: Four times a day (QID) | ORAL | Status: DC | PRN
  Administered 2014-02-21: 50 mg via ORAL
  Filled 2014-02-21: qty 1

## 2014-02-21 SURGICAL SUPPLY — 19 items
CHLORAPREP W/TINT 26ML (MISCELLANEOUS) ×3 IMPLANT
CONTAINER PREFILL 10% NBF 15ML (MISCELLANEOUS) ×6 IMPLANT
DRSG OPSITE POSTOP 3X4 (GAUZE/BANDAGES/DRESSINGS) ×3 IMPLANT
GLOVE BIOGEL PI IND STRL 6.5 (GLOVE) ×1 IMPLANT
GLOVE BIOGEL PI INDICATOR 6.5 (GLOVE) ×2
GLOVE SURG SS PI 6.0 STRL IVOR (GLOVE) ×3 IMPLANT
GOWN STRL REUS W/TWL LRG LVL3 (GOWN DISPOSABLE) ×6 IMPLANT
NEEDLE HYPO 25X1 1.5 SAFETY (NEEDLE) IMPLANT
NS IRRIG 1000ML POUR BTL (IV SOLUTION) ×3 IMPLANT
PACK ABDOMINAL MINOR (CUSTOM PROCEDURE TRAY) ×3 IMPLANT
SPONGE LAP 4X18 X RAY DECT (DISPOSABLE) IMPLANT
SUT PLAIN 0 NONE (SUTURE) ×3 IMPLANT
SUT VIC AB 0 CT1 27 (SUTURE) ×2
SUT VIC AB 0 CT1 27XBRD ANBCTR (SUTURE) ×1 IMPLANT
SUT VIC AB 3-0 PS2 18 (SUTURE) ×3 IMPLANT
SYR CONTROL 10ML LL (SYRINGE) IMPLANT
TOWEL OR 17X24 6PK STRL BLUE (TOWEL DISPOSABLE) ×6 IMPLANT
TRAY FOLEY CATH 14FR (SET/KITS/TRAYS/PACK) ×3 IMPLANT
WATER STERILE IRR 1000ML POUR (IV SOLUTION) ×3 IMPLANT

## 2014-02-21 NOTE — Progress Notes (Signed)
33 y.o. yo [id number]  status post vaginal delivery who desires permanent sterilization. Risks and benefits of the procedure were discussed with the patient including permanence of method, bleeding, infection, injury to surrounding organs and need for additional procedures. Risk failure of 0.5-1% with increased risk of ectopic gestation if pregnancy occurs was also discussed with patient.

## 2014-02-21 NOTE — Anesthesia Postprocedure Evaluation (Signed)
  Anesthesia Post-op Note  Patient: Sandra CoopAmanda Meyer  Procedure(s) Performed: Procedure(s): POST PARTUM TUBAL LIGATION (Bilateral)  Patient Location: Mother/Baby  Anesthesia Type:Epidural  Level of Consciousness: awake  Airway and Oxygen Therapy: Patient Spontanous Breathing  Post-op Pain: mild  Post-op Assessment: Patient's Cardiovascular Status Stable and Respiratory Function Stable  Post-op Vital Signs: stable  Last Vitals:  Filed Vitals:   02/21/14 1256  BP: 121/75  Pulse: 86  Temp: 36.5 C  Resp: 18    Complications: No apparent anesthesia complications

## 2014-02-21 NOTE — Op Note (Signed)
PREOPERATIVE DIAGNOSIS:  Undesired fertility  POSTOPERATIVE DIAGNOSIS:  Undesired fertility  PROCEDURE:  Postpartum Bilateral Tubal Sterilization using Pomeroy method   ANESTHESIA:  Epidural  COMPLICATIONS:  None immediate.  ESTIMATED BLOOD LOSS:  Less than 20cc.  FLUIDS: 1000 cc LR.  INDICATIONS: 33 y.o. yo [id number]  with undesired fertility,status post vaginal delivery, who desires permanent sterilization. Risks and benefits of procedure discussed with patient including permanence of method, bleeding, infection, injury to surrounding organs and need for additional procedures. Risk failure of 0.5-1% with increased risk of ectopic gestation if pregnancy occurs was also discussed with patient.   FINDINGS:  Normal uterus, tubes, and ovaries.  TECHNIQUE: After informed consent was obtained, the patient was taken to the operating room where anesthesia was induced and found to be adequate. A small transverse, infraumbilical skin incision was made with the scalpel. This incision was carried down to the underlying layer of fascia. The fascia was grasped with Kocher clamps tented up and entered sharply with Mayo scissors. Underlying peritoneum was then identified tented up and entered sharply with Metzenbaum scissors. The fascia was tagged with 0 Vicryl. The patient's left fallopian tube was then identified, brought to the incision, and grasped with a Babcock clamp. The tube was then followed out to the fimbria. The Babcock clamp was then used to grasp the tube approximately 4 cm from the cornual region. A 3 cm segment of the tube was then ligated with free tie of plain gut suture, transected and excised. Good hemostasis was noted and the tube was returned to the abdomen. The right fallopian tube was then identified to its fimbriated end, ligated, and a 3 cm segment excised in a similar fashion. Excellent hemostasis was noted, and the tube returned to the abdomen. The fascia was re-approximated with 0  Vicryl. The skin was closed in a subcuticular fashion with 3-0 Vicryl. Quarter percent Marcaine solution was then injected at the incision site. The patient tolerated the procedure well. Sponge, lap, and needle count were correct x2. The patient was taken to recovery room in stable condition.

## 2014-02-21 NOTE — Progress Notes (Signed)
Post Partum Day 1, GBS+ s/p VBAC  Subjective:  Penelope Coopmanda Drees is a 33 y.o. [id number] 6015w5d s/p VBAC.  No acute events overnight.  Pt denies problems with ambulating, voiding or po intake.  She denies nausea or vomiting.  Pain is moderately controlled.  Plan for birth control is BTL.  Method of Feeding: breast  Objective: Blood pressure 120/79, pulse 83, temperature 98.1 F (36.7 C), temperature source Oral, resp. rate 18, height 5' 11.5" (1.816 m), weight 209 lb (94.802 kg), last menstrual period 05/11/2013, SpO2 99.00%, unknown if currently breastfeeding. Body mass index is 28.75 kg/(m^2).  Physical Exam:  General: alert, cooperative and no distress Lochia:normal flow Chest: CTAB Heart: RRR no m/r/g Abdomen: +BS, soft, nontender,  Uterine Fundus: firm, below umbilicus DVT Evaluation: No evidence of DVT seen on physical exam. Extremities:  edema   Recent Labs  02/20/14 0405  HGB 11.3*  HCT 33.7*    Assessment/Plan:  ASSESSMENT: Penelope Coopmanda Padovano is a 33 y.o. [id number] 5415w5d s/p VBAC PPD#1  NPO for BTL today scheduled for 0900 Plan for discharge likely tomorrow   LOS: 1 day   Lin Glazier ROCIO 02/21/2014, 6:58 AM

## 2014-02-21 NOTE — Anesthesia Preprocedure Evaluation (Signed)
Anesthesia Evaluation  Patient identified by MRN, date of birth, ID band Patient awake    Reviewed: Allergy & Precautions, H&P , NPO status , Patient's Chart, lab work & pertinent test results  Airway Mallampati: II TM Distance: >3 FB Neck ROM: Full    Dental no notable dental hx.    Pulmonary former smoker,  breath sounds clear to auscultation  Pulmonary exam normal       Cardiovascular negative cardio ROS  Rhythm:Regular Rate:Normal     Neuro/Psych negative neurological ROS  negative psych ROS   GI/Hepatic negative GI ROS, Neg liver ROS,   Endo/Other  negative endocrine ROS  Renal/GU negative Renal ROS     Musculoskeletal negative musculoskeletal ROS (+)   Abdominal   Peds  Hematology  (+) anemia ,   Anesthesia Other Findings   Reproductive/Obstetrics                           Anesthesia Physical  Anesthesia Plan  ASA: II  Anesthesia Plan: Epidural   Post-op Pain Management:    Induction:   Airway Management Planned:   Additional Equipment:   Intra-op Plan:   Post-operative Plan:   Informed Consent: I have reviewed the patients History and Physical, chart, labs and discussed the procedure including the risks, benefits and alternatives for the proposed anesthesia with the patient or authorized representative who has indicated his/her understanding and acceptance.     Plan Discussed with:   Anesthesia Plan Comments:         Anesthesia Quick Evaluation

## 2014-02-21 NOTE — Lactation Note (Signed)
This note was copied from the chart of Sandra Meyer. Lactation Consultation Note Follow up visit at 29 hours of age.  Baby has fed 12 times in 24 hours with 4 stools and when asked mom reports a wet diaper at 3am and 3pm today.  Baby fussy and showing feeding cues now.  Mom attempts to position baby with cross cradle but is hurting for a tubal today.  Assisted with pillow support for football hold and mom continues to complain of soreness on her bottom so we reclined back.  Baby initially allowed by mom to latch shallow with few week sucks.  Showed mom how to unlatch baby and to compress breast for a deep latch.  Baby does well with more rhythmic sucking  And good strong jaw excursions.  Encouraged mom to continue to work on depth so baby gets a good supply of milk at a feeding.  Encouraged mom to document feedings and output.  Mom to call for assist as needed.     Patient Name: Sandra Meyer Reason for consult: Follow-up assessment   Maternal Data    Feeding Feeding Type: Breast Fed Length of feed:  (observed 10 minutes)  LATCH Score/Interventions Latch: Grasps breast easily, tongue down, lips flanged, rhythmical sucking.  Audible Swallowing: A few with stimulation Intervention(s): Skin to skin;Hand expression  Type of Nipple: Everted at rest and after stimulation  Comfort (Breast/Nipple): Soft / non-tender     Hold (Positioning): Assistance needed to correctly position infant at breast and maintain latch. Intervention(s): Skin to skin;Position options;Support Pillows;Breastfeeding basics reviewed  LATCH Score: 8  Lactation Tools Discussed/Used     Consult Status Consult Status: Follow-up Date: 02/22/14 Follow-up type: In-patient    Sandra Meyer, Sandra Meyer Meyer, 5:25 PM

## 2014-02-21 NOTE — Anesthesia Postprocedure Evaluation (Signed)
  Anesthesia Post Note  Patient: Landscape architectAmanda Meyer  Procedure(s) Performed: Procedure(s) (LRB): POST PARTUM TUBAL LIGATION (Bilateral)  Anesthesia type: Epidural  Patient location: PACU  Post pain: Pain level controlled  Post assessment: Post-op Vital signs reviewed  Last Vitals:  Filed Vitals:   02/21/14 1000  BP: 94/47  Pulse: 99  Temp: 36.7 C  Resp: 13    Post vital signs: Reviewed  Level of consciousness: awake  Complications: No apparent anesthesia complications

## 2014-02-21 NOTE — Transfer of Care (Signed)
Immediate Anesthesia Transfer of Care Note  Patient: Sandra Meyer  Procedure(s) Performed: Procedure(s): POST PARTUM TUBAL LIGATION (Bilateral)  Patient Location: PACU  Anesthesia Type:Epidural  Level of Consciousness: awake, alert  and oriented  Airway & Oxygen Therapy: Patient Spontanous Breathing  Post-op Assessment: Report given to PACU RN and Post -op Vital signs reviewed and stable  Post vital signs: Reviewed and stable  Complications: No apparent anesthesia complications

## 2014-02-21 NOTE — Addendum Note (Signed)
Addendum created 02/21/14 1505 by Renford DillsJanet L Cesare Sumlin, CRNA   Modules edited: Notes Section   Notes Section:  File: [id number]

## 2014-02-22 ENCOUNTER — Inpatient Hospital Stay (HOSPITAL_COMMUNITY): Admission: RE | Admit: 2014-02-22 | Payer: Medicaid Other | Source: Ambulatory Visit

## 2014-02-22 ENCOUNTER — Encounter (HOSPITAL_COMMUNITY): Payer: Self-pay | Admitting: Obstetrics and Gynecology

## 2014-02-22 MED ORDER — IBUPROFEN 600 MG PO TABS: 600.0000 mg | ORAL_TABLET | Freq: Four times a day (QID) | ORAL | Status: DC

## 2014-02-22 NOTE — Progress Notes (Signed)
UR chart review completed.  

## 2014-02-22 NOTE — Discharge Instructions (Signed)
Vaginal Delivery, Care After °Refer to this sheet in the next few weeks. These discharge instructions provide you with information on caring for yourself after delivery. Your caregiver may also give you specific instructions. Your treatment has been planned according to the most current medical practices available, but problems sometimes occur. Call your caregiver if you have any problems or questions after you go home. °HOME CARE INSTRUCTIONS °· Take over-the-counter or prescription medicines only as directed by your caregiver or pharmacist. °· Do not drink alcohol, especially if you are breastfeeding or taking medicine to relieve pain. °· Do not chew or smoke tobacco. °· Do not use illegal drugs. °· Continue to use good perineal care. Good perineal care includes: °¨ Wiping your perineum from front to back. °¨ Keeping your perineum clean. °· Do not use tampons or douche until your caregiver says it is okay. °· Shower, wash your hair, and take tub baths as directed by your caregiver. °· Wear a well-fitting bra that provides breast support. °· Eat healthy foods. °· Drink enough fluids to keep your urine clear or pale yellow. °· Eat high-fiber foods such as whole grain cereals and breads, brown rice, beans, and fresh fruits and vegetables every day. These foods may help prevent or relieve constipation. °· Follow your caregiver's recommendations regarding resumption of activities such as climbing stairs, driving, lifting, exercising, or traveling. °· Talk to your caregiver about resuming sexual activities. Resumption of sexual activities is dependent upon your risk of infection, your rate of healing, and your comfort and desire to resume sexual activity. °· Try to have someone help you with your household activities and your newborn for at least a few days after you leave the hospital. °· Rest as much as possible. Try to rest or take a nap when your newborn is sleeping. °· Increase your activities gradually. °· Keep  all of your scheduled postpartum appointments. It is very important to keep your scheduled follow-up appointments. At these appointments, your caregiver will be checking to make sure that you are healing physically and emotionally. °SEEK MEDICAL CARE IF:  °· You are passing large clots from your vagina. Save any clots to show your caregiver. °· You have a foul smelling discharge from your vagina. °· You have trouble urinating. °· You are urinating frequently. °· You have pain when you urinate. °· You have a change in your bowel movements. °· You have increasing redness, pain, or swelling near your vaginal incision (episiotomy) or vaginal tear. °· You have pus draining from your episiotomy or vaginal tear. °· Your episiotomy or vaginal tear is separating. °· You have painful, hard, or reddened breasts. °· You have a severe headache. °· You have blurred vision or see spots. °· You feel sad or depressed. °· You have thoughts of hurting yourself or your newborn. °· You have questions about your care, the care of your newborn, or medicines. °· You are dizzy or light-headed. °· You have a rash. °· You have nausea or vomiting. °· You were breastfeeding and have not had a menstrual period within 12 weeks after you stopped breastfeeding. °· You are not breastfeeding and have not had a menstrual period by the 12th week after delivery. °· You have a fever. °SEEK IMMEDIATE MEDICAL CARE IF:  °· You have persistent pain. °· You have chest pain. °· You have shortness of breath. °· You faint. °· You have leg pain. °· You have stomach pain. °· Your vaginal bleeding saturates two or more sanitary pads   in 1 hour. °MAKE SURE YOU:  °· Understand these instructions. °· Will watch your condition. °· Will get help right away if you are not doing well or get worse. °Document Released: 05/04/2000 Document Revised: 09/21/2013 Document Reviewed: 01/02/2012 °ExitCare® Patient Information ©2015 [employer]. This information is not intended to  replace advice given to you by your health care provider. Make sure you discuss any questions you have with your health care provider. ° °Postpartum Tubal Ligation °Care After °Refer to this sheet in the next few weeks. These instructions provide you with information on caring for yourself after your procedure. Your caregiver may also give you more specific instructions. Your treatment has been planned according to current medical practices, but problems sometimes occur. Call your caregiver if you have any problems or questions after your procedure. °HOME CARE INSTRUCTIONS  °· Rest the remainder of the day. °· Only take over-the-counter or prescription medicines for pain, discomfort, or fever as directed by your caregiver. Do not take aspirin. It can cause bleeding. °· Gradually resume daily activities, diet, rest, driving, and work. °· Avoid sexual intercourse for 2 weeks or as directed. °· Do not drive while taking pain medicine. °· Do not lift anything over 5 pounds for 2 weeks or as directed. °· Only take showers, not baths, until you are seen by your caregiver. °· Change bandages (dressings) as directed. °· Take your temperature twice a day and record it. °· Try to have help for the first 7-10 days for your household needs. °· Return to your caregiver to get your stitches (sutures) removed and for follow-up visits as directed. °SEEK MEDICAL CARE IF:  °· You have redness, swelling, or increasing pain in the wound. °· You have drainage from the wound lasting longer than 1 day. °· Your pain is getting worse. °· You have a rash. °· You become dizzy or lightheaded. °· You have a reaction to your medicine. °· You need stronger medicine or a change in your pain medicine. °· You notice a bad smell coming from the wound or dressing. °· Your wound breaks open after the sutures have been removed. °· You are constipated. °SEEK IMMEDIATE MEDICAL CARE IF:  °· You faint. °· You have a fever. °· You have increasing abdominal  pain. °· You have severe pain in your shoulders. °· You have bleeding or drainage from the suture sites or vagina following surgery. °· You have shortness of breath or difficulty breathing. °· You have chest or leg pain. °· You have persistent nausea, vomiting, or diarrhea. °MAKE SURE YOU:  °· Understand these instructions. °· Watch your condition. °· Get help right away if you are not doing well or get worse. °Document Released: 11/06/2011 Document Reviewed: 11/06/2011 °ExitCare® Patient Information ©2015 [employer]. This information is not intended to replace advice given to you by your health care provider. Make sure you discuss any questions you have with your health care provider. ° ° °

## 2014-02-22 NOTE — Lactation Note (Signed)
This note was copied from the [facility]. Lactation Consultation Note: Follow up visit with this experienced BF mom . She has baby latched to breast when I went into room. Reports he is nursing well but was hungry during the night. So she gave some formula during the night. Encouraged to always BF first then give formula if baby still acts hungry. Reports that breasts are feeling a little fuller this morning and she is able to hand express easier. No questions at present. To call prn  Patient Name: Sandra Meyer ZOXWR'UToday's Date: 02/22/2014 Reason for consult: Follow-up assessment   Maternal Data Formula Feeding for Exclusion: No Has patient been taught Hand Expression?: Yes Does the patient have breastfeeding experience prior to this delivery?: Yes  Feeding Feeding Type: Breast Fed  LATCH Score/Interventions Latch: Grasps breast easily, tongue down, lips flanged, rhythmical sucking. Intervention(s): Adjust position  Audible Swallowing: A few with stimulation Intervention(s): Skin to skin;Hand expression Intervention(s): Skin to skin;Hand expression  Type of Nipple: Everted at rest and after stimulation  Comfort (Breast/Nipple): Soft / non-tender     Hold (Positioning): No assistance needed to correctly position infant at breast.  LATCH Score: 9  Lactation Tools Discussed/Used     Consult Status Consult Status: Complete    Pamelia HoitWeeks, Sandra Meyer 02/22/2014, 9:25 AM

## 2014-02-22 NOTE — Progress Notes (Signed)
Post Partum Day 2 Subjective: up ad lib, voiding, tolerating PO and + flatus Complains of mild tenderness over incision site  Objective: Blood pressure 108/63, pulse 75, temperature 98.1 F (36.7 C), temperature source Oral, resp. rate 17, height 5' 11.5" (1.816 m), weight 94.802 kg (209 lb), last menstrual period 05/11/2013, SpO2 97.00%, unknown if currently breastfeeding.  Physical Exam:  General: alert, cooperative and appears stated age Lochia: appropriate Uterine Fundus: firm Incision: healing well, no significant drainage DVT Evaluation: No evidence of DVT seen on physical exam. No cords or calf tenderness. No significant calf/ankle edema.   Recent Labs  02/20/14 0405  HGB 11.3*  HCT 33.7*    Assessment/Plan: Discharge home and Breastfeeding Contraception: BLT POD#1 Circumcision is being discussed with FOB    LOS: 2 days   Rosemary HolmsHaller, Terrence Wishon 02/22/2014, 7:28 AM

## 2014-02-22 NOTE — Progress Notes (Signed)
I saw and evaluated the patient. I agree with the findings and the plan of care as documented in the resident's note

## 2014-02-22 NOTE — Discharge Summary (Signed)
OB fellow attestation:  I have seen and examined this patient; I agree with above documentation in the resident's note.   Sandra Meyer is a 33 y.o. [id number] s/p NSVD. Doing well, bleeding less than menses. + ambulation, passing gas. Pain well controlled.   PE: BP 110/72  Pulse 81  Temp(Src) 98 F (36.7 C) (Oral)  Resp 18  Ht 5' 11.5" (1.816 m)  Wt 209 lb (94.802 kg)  BMI 28.75 kg/m2  SpO2 97%  LMP 05/11/2013  Breastfeeding? Unknown Gen: calm comfortable, NAD Resp: normal effort, no distress Abd: uterus below umbilcius  ROS, labs, PMH reviewed    Plan: - d/c home - f/up reg OB 4-6 wsks   Elita Booneoberts, Christine Schiefelbein C, MD 10:37 AM

## 2014-02-22 NOTE — Discharge Summary (Signed)
Obstetric Discharge Summary Reason for Admission: onset of labor Prenatal Procedures: none Intrapartum Procedures: Vaginal delivery after c-section Postpartum Procedures: P.P. tubal ligation Complications-Operative and Postpartum: 2nd degree perineal laceration Hemoglobin  Date Value Ref Range Status  02/20/2014 11.3* 12.0 - 15.0 g/dL Final     HCT  Date Value Ref Range Status  02/20/2014 33.7* 36.0 - 46.0 % Final    Physical Exam:  General: alert and cooperative Lochia: appropriate Uterine Fundus: firm Incision: healing well, no significant drainage DVT Evaluation: No evidence of DVT seen on physical exam.  Discharge Diagnoses: Term Pregnancy-delivered  Discharge Information: Date: 02/22/2014 Activity: pelvic rest Diet: routine Medications: Ibuprofen Condition: stable Instructions: refer to practice specific booklet Discharge to: home   Newborn Data: Live born female  Birth Weight: 6 lb 13.2 oz (3095 g) APGAR: 8, 9  Home with mother.  Rosemary HolmsHaller, Sandra Meyer 02/22/2014, 9:07 AM

## 2014-03-09 ENCOUNTER — Telehealth: Payer: Self-pay | Admitting: *Deleted

## 2014-03-09 NOTE — Telephone Encounter (Signed)
Attempted to contact patient, left message for patient to come by at earliest convenience to fill out paper work.

## 2014-03-22 ENCOUNTER — Encounter (HOSPITAL_COMMUNITY): Payer: Self-pay | Admitting: Obstetrics and Gynecology

## 2014-03-29 ENCOUNTER — Ambulatory Visit: Payer: Medicaid Other | Admitting: Obstetrics & Gynecology

## 2014-04-19 ENCOUNTER — Encounter: Payer: Self-pay | Admitting: Obstetrics & Gynecology

## 2014-04-19 ENCOUNTER — Ambulatory Visit: Payer: Medicaid Other | Admitting: Obstetrics & Gynecology

## 2014-04-19 ENCOUNTER — Telehealth: Payer: Self-pay | Admitting: *Deleted

## 2014-04-19 NOTE — Telephone Encounter (Signed)
Contacted patient and informed of missed appointment.  Pt states she will call and reschedule in the am.

## 2014-04-20 ENCOUNTER — Encounter: Payer: Self-pay | Admitting: Obstetrics & Gynecology

## 2014-05-03 ENCOUNTER — Telehealth: Payer: Self-pay | Admitting: *Deleted

## 2014-05-03 NOTE — Telephone Encounter (Signed)
Pt called clinic to inquire if she had the TDap vaccine due to her children being hospitalized.  Attempted to contact patient, left message that she had the Tdap vaccine 11/18/13. If she has any further questions to call the clinic.

## 2014-05-17 ENCOUNTER — Encounter: Payer: Self-pay | Admitting: *Deleted

## 2015-04-17 ENCOUNTER — Emergency Department (HOSPITAL_COMMUNITY): Payer: Medicaid Other

## 2015-04-17 ENCOUNTER — Emergency Department (HOSPITAL_COMMUNITY)
Admission: EM | Admit: 2015-04-17 | Discharge: 2015-04-17 | Disposition: A | Discharge status: Home / Self Care | Payer: Medicaid Other | Attending: Emergency Medicine | Admitting: Emergency Medicine

## 2015-04-17 DIAGNOSIS — Z3202 Encounter for pregnancy test, result negative: Secondary | ICD-10-CM | POA: Diagnosis not present

## 2015-04-17 DIAGNOSIS — M545 Low back pain: Secondary | ICD-10-CM

## 2015-04-17 DIAGNOSIS — Z862 Personal history of diseases of the blood and blood-forming organs and certain disorders involving the immune mechanism: Secondary | ICD-10-CM | POA: Insufficient documentation

## 2015-04-17 DIAGNOSIS — M5440 Lumbago with sciatica, unspecified side: Secondary | ICD-10-CM | POA: Insufficient documentation

## 2015-04-17 DIAGNOSIS — Z87891 Personal history of nicotine dependence: Secondary | ICD-10-CM | POA: Diagnosis not present

## 2015-04-17 DIAGNOSIS — Z79899 Other long term (current) drug therapy: Secondary | ICD-10-CM | POA: Diagnosis not present

## 2015-04-17 DIAGNOSIS — Z880 Allergy status to penicillin: Secondary | ICD-10-CM | POA: Insufficient documentation

## 2015-04-17 LAB — I-STAT BETA HCG BLOOD, ED (MC, WL, AP ONLY): I-stat hCG, quantitative: <5 m[IU]/mL (ref <5)

## 2015-04-17 MED ORDER — LIDOCAINE 5 % EX PTCH: 1.0000 | MEDICATED_PATCH | CUTANEOUS | Status: DC

## 2015-04-17 MED ORDER — KETOROLAC TROMETHAMINE 60 MG/2ML IM SOLN
60.0000 mg | Freq: Once | INTRAMUSCULAR | Status: AC
  Administered 2015-04-17: 60 mg via INTRAMUSCULAR
  Filled 2015-04-17: qty 2

## 2015-04-17 NOTE — Discharge Instructions (Signed)
Please follow-up with community health and wellness in order to establish primary care for further evaluation and management of your back pain. There is not appear to be an emergent cause her symptoms at this time. Your x-rays were negative. Return to ED for any new or worsening symptoms.  Back Pain, Adult Back pain is very common in adults.The cause of back pain is rarely dangerous and the pain often gets better over time.The cause of your back pain may not be known. Some common causes of back pain include:  Strain of the muscles or ligaments supporting the spine.  Wear and tear (degeneration) of the spinal disks.  Arthritis.  Direct injury to the back. For many people, back pain may return. Since back pain is rarely dangerous, most people can learn to manage this condition on their own. HOME CARE INSTRUCTIONS Watch your back pain for any changes. The following actions may help to lessen any discomfort you are feeling:  Remain active. It is stressful on your back to sit or stand in one place for long periods of time. Do not sit, drive, or stand in one place for more than 30 minutes at a time. Take short walks on even surfaces as soon as you are able.Try to increase the length of time you walk each day.  Exercise regularly as directed by your health care provider. Exercise helps your back heal faster. It also helps avoid future injury by keeping your muscles strong and flexible.  Do not stay in bed.Resting more than 1-2 days can delay your recovery.  Pay attention to your body when you bend and lift. The most comfortable positions are those that put less stress on your recovering back. Always use proper lifting techniques, including:  Bending your knees.  Keeping the load close to your body.  Avoiding twisting.  Find a comfortable position to sleep. Use a firm mattress and lie on your side with your knees slightly bent. If you lie on your back, put a pillow under your  knees.  Avoid feeling anxious or stressed.Stress increases muscle tension and can worsen back pain.It is important to recognize when you are anxious or stressed and learn ways to manage it, such as with exercise.  Take medicines only as directed by your health care provider. Over-the-counter medicines to reduce pain and inflammation are often the most helpful.Your health care provider may prescribe muscle relaxant drugs.These medicines help dull your pain so you can more quickly return to your normal activities and healthy exercise.  Apply ice to the injured area:  Put ice in a plastic bag.  Place a towel between your skin and the bag.  Leave the ice on for 20 minutes, 2-3 times a day for the first 2-3 days. After that, ice and heat may be alternated to reduce pain and spasms.  Maintain a healthy weight. Excess weight puts extra stress on your back and makes it difficult to maintain good posture. SEEK MEDICAL CARE IF:  You have pain that is not relieved with rest or medicine.  You have increasing pain going down into the legs or buttocks.  You have pain that does not improve in one week.  You have night pain.  You lose weight.  You have a fever or chills. SEEK IMMEDIATE MEDICAL CARE IF:   You develop new bowel or bladder control problems.  You have unusual weakness or numbness in your arms or legs.  You develop nausea or vomiting.  You develop abdominal pain.  You  feel faint.   This information is not intended to replace advice given to you by your health care provider. Make sure you discuss any questions you have with your health care provider.   Document Released: 05/07/2005 Document Revised: 05/28/2014 Document Reviewed: 09/08/2013 Elsevier Interactive Patient Education [employer].

## 2015-04-17 NOTE — ED Provider Notes (Signed)
CSN: [id number]     Arrival date & time 04/17/15  8657 History  By signing my name below, I, Sandra Meyer, attest that this documentation has been prepared under the direction and in the presence of Sandra Meyer. Electronically Signed: Elon Meyer ED Scribe. 04/17/2015. 10:43 AM.    No chief complaint on file.  The history is provided by the patient. No language interpreter was used.    HPI Comments: Sandra Meyer is a 34 y.o. female who presents to the Emergency Department complaining of worsening, 7-8/10 lower back pain onset 13 months ago after giving birth.  The pain is worse with sitting and she has tried stretching, intermittent ibuprofen and Tylenol and warm compresses with only minimal relief.  She denies personal hx of CA.  She denies extremity numbness/weakness, fever, bowel/bladder incontinence.  Patient reports an allergy to penicillin and Codeine.     PCP: none currently.  Patient recently was approved for Medicaid.   Past Medical History  Diagnosis Date  . Anemia   . AMA (advanced maternal age) multigravida 35+    Past Surgical History  Procedure Laterality Date  . Cesarean section    . Tonsillectomy    . Tubal ligation Bilateral 02/21/2014    Procedure: POST PARTUM TUBAL LIGATION;  Surgeon: Sandra Antigua, MD;  Location: [facility];  Service: Gynecology;  Laterality: Bilateral;   Family History  Problem Relation Age of Onset  . Cancer Father   . Hypertension Mother   . Varicose Veins Mother    Social History  Substance Use Topics  . Smoking status: Former Smoker    Types: Cigarettes    Quit date: 11/13/2012  . Smokeless tobacco: Never Used  . Alcohol Use: No   OB History    Gravida Para Term Preterm AB TAB SAB Ectopic Multiple Living   Review of Systems A complete 10 system review of systems was obtained and all systems are negative except as noted in the HPI and PMH.   Allergies  Codeine and Penicillins  Home Medications   Prior  to Admission medications   Medication Sig Start Date End Date Taking? Authorizing Provider  calcium carbonate (TUMS - DOSED IN MG ELEMENTAL CALCIUM) 500 MG chewable tablet Chew 1 tablet by mouth daily.    Historical Provider, MD  ibuprofen (ADVIL,MOTRIN) 600 MG tablet Take 1 tablet (600 mg total) by mouth every 6 (six) hours. 02/22/14   Sandra Chick, MD  lidocaine (LIDODERM) 5 % Place 1 patch onto the skin daily. Remove & Discard patch within 12 hours or as directed by MD 04/17/15   Sandra Peek, PA-C  ranitidine (ZANTAC) 150 MG tablet Take 1 tablet (150 mg total) by mouth 2 (two) times daily. 10/21/13   Sandra Meyer, CNM   BP 102/69 mmHg  Pulse 73  Temp(Src) 98.1 F (36.7 C) (Oral)  Resp 18  Ht 5' 11.5" (1.816 m)  Wt 74.844 kg  BMI 22.69 kg/m2  SpO2 99%  LMP 03/10/2015  Breastfeeding? Yes Physical Exam  Constitutional: She is oriented to person, place, and time. She appears well-developed and well-nourished. No distress.  HENT:  Head: Normocephalic and atraumatic.  Eyes: Conjunctivae and EOM are normal.  Neck: Neck supple. No tracheal deviation present.  Cardiovascular: Normal rate, regular rhythm and normal heart sounds.   Pulmonary/Chest: Effort normal. No respiratory distress.  Lungs CTA.  Abdominal: Soft. There is no tenderness.  Musculoskeletal: Normal range  of motion.  Diffuse tenderness in paraspinal musculature specifically over SI joints bilaterally with no erythema, swelling, bony crepitus, or other lesions or deformities.    Neurological: She is alert and oriented to person, place, and time.  Gait is somewhat antalgic but no ataxia.  Motor strength and sensation appear baseline.    Skin: Skin is warm and dry.  Psychiatric: She has a normal mood and affect. Her behavior is normal.  Nursing note and vitals reviewed.   ED Course  Procedures (including critical care time)  DIAGNOSTIC STUDIES: Oxygen Saturation is 100% on RA, normal by my interpretation.     COORDINATION OF CARE:  10:39 AM Will order anti-inflammatory.  Patient should continue stretches, warm compresses.  Patient should f/u with PCP.  Will provide orthopaedic referral.  Patient acknowledges and agrees with plan.    Labs Review Labs Reviewed  I-STAT BETA HCG BLOOD, ED ([facility], [facility], [facility] ONLY)    Imaging Review Dg Lumbar Spine Complete  04/17/2015  CLINICAL DATA:  Lower lumbar pain radiating to lower extremities bilaterally 1 year. Symptoms worse recently. EXAM: LUMBAR SPINE - COMPLETE 4+ VIEW COMPARISON:  None. FINDINGS: There is no evidence of lumbar spine fracture. Alignment is normal. Intervertebral disc spaces are maintained. IMPRESSION: Negative. Electronically Signed   By: Elberta Fortisaniel  Meyer M.D.   On: 04/17/2015 13:13   I have personally reviewed and evaluated these images and lab results as part of my medical decision-making.   EKG Interpretation None     Filed Vitals:   04/17/15 1001 04/17/15 1142  BP: 111/75 102/69  Pulse: 93 73  Temp: 98.4 F (36.9 C) 98.1 F (36.7 C)  TempSrc: Oral Oral  Resp: 18 18  Height: 5' 11.5" (1.816 m)   Weight: 74.844 kg   SpO2: 100% 99%   Meds given in ED:  Medications  ketorolac (TORADOL) injection 60 mg (60 mg Intramuscular Given 04/17/15 1103)    New Prescriptions   LIDOCAINE (LIDODERM) 5 %    Place 1 patch onto the skin daily. Remove & Discard patch within 12 hours or as directed by MD    MDM  Sandra Meyer is a 34 y.o. female because in for evaluation of low back pain since childbirth approximately 1.5 years ago. Pain is worsened with certain movements and palpation. Intermittently treated with Tylenol and ibuprofen with some relief. On arrival, she is hemodynamically stable, normal vital signs and afebrile. On exam she has diffuse tenderness throughout the paraspinal musculature. No pathologic back pain red flags. Gait is somewhat antalgic but no ataxia, remains neurovascularly intact. Discussed we'll give Toradol IM and  obtain baseline plain films of lumbar spine. We'll give referral to [facility] in order to establish primary care. Patient verbalizes understanding and agrees with this plan. No evidence of other acute emergent pathology at this time. Patient overall appears well, nontoxic and is appropriate for discharge. Final diagnoses:  Bilateral low back pain, with sciatica presence unspecified   I personally performed the services described in this documentation, which was scribed in my presence. The recorded information has been reviewed and is accurate.     Sandra PeekBenjamin Carina Chaplin, PA-C 04/17/15 1323  Gerhard Munchobert Lockwood, MD 04/23/15 [id number]

## 2015-04-17 NOTE — ED Notes (Signed)
Pt c/o bil lower back pain onset x 1.5 yrs ago since delivering her son, pt denies trauma or fall, pt ambulatory, pt denies bowel & bladder incontinence, A&O x4

## 2015-12-17 ENCOUNTER — Encounter (HOSPITAL_COMMUNITY): Payer: Self-pay | Admitting: Emergency Medicine

## 2015-12-17 ENCOUNTER — Emergency Department (HOSPITAL_COMMUNITY): Payer: Medicaid Other

## 2015-12-17 ENCOUNTER — Emergency Department (HOSPITAL_COMMUNITY)
Admission: EM | Admit: 2015-12-17 | Discharge: 2015-12-17 | Disposition: A | Discharge status: Home / Self Care | Payer: Medicaid Other | Attending: Emergency Medicine | Admitting: Emergency Medicine

## 2015-12-17 DIAGNOSIS — Z87891 Personal history of nicotine dependence: Secondary | ICD-10-CM | POA: Diagnosis not present

## 2015-12-17 DIAGNOSIS — Y9302 Activity, running: Secondary | ICD-10-CM | POA: Insufficient documentation

## 2015-12-17 DIAGNOSIS — W19XXXA Unspecified fall, initial encounter: Secondary | ICD-10-CM

## 2015-12-17 DIAGNOSIS — Y929 Unspecified place or not applicable: Secondary | ICD-10-CM | POA: Insufficient documentation

## 2015-12-17 DIAGNOSIS — W010XXA Fall on same level from slipping, tripping and stumbling without subsequent striking against object, initial encounter: Secondary | ICD-10-CM | POA: Diagnosis not present

## 2015-12-17 DIAGNOSIS — Y999 Unspecified external cause status: Secondary | ICD-10-CM | POA: Diagnosis not present

## 2015-12-17 DIAGNOSIS — S51011A Laceration without foreign body of right elbow, initial encounter: Secondary | ICD-10-CM | POA: Insufficient documentation

## 2015-12-17 DIAGNOSIS — Z79899 Other long term (current) drug therapy: Secondary | ICD-10-CM | POA: Diagnosis not present

## 2015-12-17 DIAGNOSIS — Z23 Encounter for immunization: Secondary | ICD-10-CM | POA: Diagnosis not present

## 2015-12-17 DIAGNOSIS — S41111A Laceration without foreign body of right upper arm, initial encounter: Secondary | ICD-10-CM

## 2015-12-17 DIAGNOSIS — S59901A Unspecified injury of right elbow, initial encounter: Secondary | ICD-10-CM | POA: Diagnosis present

## 2015-12-17 MED ORDER — CEPHALEXIN 250 MG PO CAPS
500.0000 mg | ORAL_CAPSULE | Freq: Once | ORAL | Status: AC
  Administered 2015-12-17: 500 mg via ORAL
  Filled 2015-12-17: qty 2

## 2015-12-17 MED ORDER — LIDOCAINE-EPINEPHRINE (PF) 2 %-1:200000 IJ SOLN
10.0000 mL | Freq: Once | INTRAMUSCULAR | Status: AC
  Administered 2015-12-17: 10 mL via INTRADERMAL
  Filled 2015-12-17: qty 20

## 2015-12-17 MED ORDER — TETANUS-DIPHTH-ACELL PERTUSSIS 5-2.5-18.5 LF-MCG/0.5 IM SUSP
0.5000 mL | Freq: Once | INTRAMUSCULAR | Status: AC
  Administered 2015-12-17: 0.5 mL via INTRAMUSCULAR
  Filled 2015-12-17: qty 0.5

## 2015-12-17 MED ORDER — ACETAMINOPHEN 325 MG PO TABS
650.0000 mg | ORAL_TABLET | Freq: Once | ORAL | Status: AC
  Administered 2015-12-17: 650 mg via ORAL
  Filled 2015-12-17: qty 2

## 2015-12-17 MED ORDER — CEPHALEXIN 500 MG PO CAPS: 500.0000 mg | ORAL_CAPSULE | Freq: Two times a day (BID) | ORAL | 0 refills | Status: DC

## 2015-12-17 NOTE — ED Notes (Signed)
PA at bedside.

## 2015-12-17 NOTE — ED Triage Notes (Signed)
Pt. tripped and fell on her right side while running this afternoon , denies LOC / ambulatory , reports pain with abrasions at right elbow , pain increases with movement . Abrasions at right knee .

## 2015-12-17 NOTE — ED Provider Notes (Signed)
[facility] Provider Note   CSN: [id number] Arrival date & time: 12/17/15  2013  First Provider Contact:   First MD Initiated Contact with Patient 12/17/15 2226    By signing my name below, I, Evon Slack, attest that this documentation has been prepared under the direction and in the presence of Marlon Pel, PA-C. Electronically Signed: Evon Slack, ED Scribe. 12/17/15. 10:27 PM.     History   Chief Complaint Chief Complaint  Patient presents with  . Fall    HPI Sandra Meyer is a 35 y.o. female.  The history is provided by the patient. No language interpreter was used.  Fall    HPI Comments: Sandra Meyer is a 35 y.o. female who presents to the Emergency Department complaining of fall onset 9 hours prior. Pt states that she fell onto her right elbow. Pt states that she was running across a bridge tripped and fell. Pt presents with small wound to her right elbow and the bleeding is controlled. Pt is complaining of right elbow pain that's worse with movement. Pt doesn't report any medications PTA. Pt has applied ice PTA. Pt denies head injury or LOC. Pt doesn't report numbness or tingling.   Past Medical History:  Diagnosis Date  . AMA (advanced maternal age) multigravida 35+   . Anemia     Patient Active Problem List   Diagnosis Date Noted  . Post-dates pregnancy 02/20/2014  . Group B Streptococcus urinary tract infection complicating pregnancy in second trimester 08/31/2013  . Supervision of normal subsequent pregnancy 08/27/2013  . Previous cesarean delivery affecting pregnancy, antepartum 08/27/2013    Past Surgical History:  Procedure Laterality Date  . ABDOMINAL HYSTERECTOMY    . CESAREAN SECTION    . TONSILLECTOMY    . TUBAL LIGATION Bilateral 02/21/2014   Procedure: POST PARTUM TUBAL LIGATION;  Surgeon: Catalina Antigua, MD;  Location: [facility];  Service: Gynecology;  Laterality: Bilateral;    OB History    Gravida Para Term Preterm AB Living    3 2 2   1 2    SAB TAB Ectopic Multiple Live Births   1               Home Medications    Prior to Admission medications   Medication Sig Start Date End Date Taking? Authorizing Provider  calcium carbonate (TUMS - DOSED IN MG ELEMENTAL CALCIUM) 500 MG chewable tablet Chew 1 tablet by mouth daily.    Historical Provider, MD  cephALEXin (KEFLEX) 500 MG capsule Take 1 capsule (500 mg total) by mouth 2 (two) times daily. 12/17/15   Emberlyn Burlison Neva Seat, PA-C  ibuprofen (ADVIL,MOTRIN) 600 MG tablet Take 1 tablet (600 mg total) by mouth every 6 (six) hours. 02/22/14   Ethelda Chick, MD  lidocaine (LIDODERM) 5 % Place 1 patch onto the skin daily. Remove & Discard patch within 12 hours or as directed by MD 04/17/15   Joycie Peek, PA-C  ranitidine (ZANTAC) 150 MG tablet Take 1 tablet (150 mg total) by mouth 2 (two) times daily. 10/21/13   Hurshel Party, CNM    Family History Family History  Problem Relation Age of Onset  . Cancer Father   . Hypertension Mother   . Varicose Veins Mother     Social History Social History  Substance Use Topics  . Smoking status: Former Smoker    Types: Cigarettes    Quit date: 11/13/2012  . Smokeless tobacco: Never Used  . Alcohol use No     Allergies  Codeine and Penicillins   Review of Systems Review of Systems  Skin: Positive for wound.  Neurological: Negative for syncope and numbness.     Physical Exam Updated Vital Signs BP 102/68 (BP Location: Left Arm)   Pulse 65   Temp 98.2 F (36.8 C) (Oral)   Resp 16   Ht 5\' 11"  (1.803 m)   Wt 77.1 kg   LMP 03/10/2015   SpO2 99%   BMI 23.71 kg/m   Physical Exam  Constitutional: She appears well-developed and well-nourished. No distress.  HENT:  Head: Normocephalic and atraumatic.  Eyes: Conjunctivae are normal.  Pulmonary/Chest: Effort normal. No respiratory distress.  Musculoskeletal: Normal range of motion.  Pain with range of motion of the right elbow. She has 1 small 1 cm  laceration over the olecranon process. She has some swelling and inflammation to the bursa. She has 1 distal humerus laceration approximately 0.25 cm. The wounds are dirty and the edges are not straight.  Patient has full range of motion of her wrist in all 5 fingers, intact sensation and radial pulse.  Neurological: She is alert. Coordination normal.  Skin: Skin is warm and dry. She is not diaphoretic.  Psychiatric: She has a normal mood and affect. Her behavior is normal.  Nursing note and vitals reviewed.    ED Treatments / Results  DIAGNOSTIC STUDIES: Oxygen Saturation is 99% on RA, normal by my interpretation.    COORDINATION OF CARE: 10:33 PM-Discussed treatment plan which includes wound care with pt at bedside and pt agreed to plan.     Labs (all labs ordered are listed, but only abnormal results are displayed) Labs Reviewed - No data to display  EKG  EKG Interpretation None       Radiology Dg Elbow Complete Right  Result Date: 12/17/2015 CLINICAL DATA:  Fall today with posterior elbow laceration and visible bone, initial encounter EXAM: RIGHT ELBOW - COMPLETE 3+ VIEW COMPARISON:  None. FINDINGS: Posterior soft tissue defect is noted consistent with the given clinical history. A few small densities are noted within the soft tissue defect likely related to small foreign bodies. No bony abnormality is seen. IMPRESSION: Soft tissue defect with small foreign bodies. No acute bony abnormality is noted. Electronically Signed   By: Alcide Clever M.D.   On: 12/17/2015 21:20   Procedures Procedures (including critical care time)  Medications Ordered in ED Medications  lidocaine-EPINEPHrine (XYLOCAINE W/EPI) 2 %-1:200000 (PF) injection 10 mL (10 mLs Intradermal Given 12/17/15 2251)  Tdap (BOOSTRIX) injection 0.5 mL (0.5 mLs Intramuscular Given 12/17/15 2252)  cephALEXin (KEFLEX) capsule 500 mg (500 mg Oral Given 12/17/15 2251)  acetaminophen (TYLENOL) tablet 650 mg (650 mg Oral  Given 12/17/15 2251)     Initial Impression / Assessment and Plan / ED Course  I have reviewed the triage vital signs and the nursing notes.  Pertinent labs & imaging results that were available during my care of the patient were reviewed by me and considered in my medical decision making (see chart for details).  Clinical Course    LACERATION REPAIR Performed by: Dorthula Matas Authorized by: Dorthula Matas Consent: Verbal consent obtained. Risks and benefits: risks, benefits and alternatives were discussed Consent given by: patient Patient identity confirmed: provided demographic data Prepped and Draped in normal sterile fashion Wound explored  Laceration Location: right elbow  Laceration Length: 1 cm  No Foreign Bodies seen or palpated  Anesthesia: local infiltration  Local anesthetic: lidocaine 2 % with epinephrine  Anesthetic total: 3  ml  Irrigation method: syringe Amount of cleaning: standard  Skin closure: sutures  Number of sutures: 3  Technique: simple interrupted with 5'0 prolene  Patient tolerance: Patient tolerated the procedure well with no immediate complications.  I discussed with patient that closing the wound over the olecranon process was risky due to the fact that it was dirty and has been left open for so long. However, the wound is over an area of hypertension and gapes her elbow is flexed. Therefore the wound edges were loosely approximated to being copiously irrigated with saline. A few small foreign bodies were removed.  Discussed with patient she is at risk for bursitis, placed a pressure dressing, discussed follow-up and need to have sutures removed in 10 days; recommend follow-up with hand.   Final Clinical Impressions(s) / ED Diagnoses   Final diagnoses:  Fall, initial encounter  Laceration of right upper extremity, initial encounter    New Prescriptions New Prescriptions   CEPHALEXIN (KEFLEX) 500 MG CAPSULE    Take 1 capsule  (500 mg total) by mouth 2 (two) times daily.    I discussed results, diagnoses and plan with [facility]. They voice there understanding and questions were answered. We discussed follow-up recommendations and return precautions.    Marlon Pel, PA-C 12/17/15 2324    Rolland Porter, MD 12/28/15 [phone]

## 2016-04-15 ENCOUNTER — Emergency Department (HOSPITAL_COMMUNITY)
Admission: EM | Admit: 2016-04-15 | Discharge: 2016-04-15 | Disposition: A | Discharge status: Home / Self Care | Payer: Medicaid Other | Attending: Emergency Medicine | Admitting: Emergency Medicine

## 2016-04-15 ENCOUNTER — Encounter (HOSPITAL_COMMUNITY): Payer: Self-pay

## 2016-04-15 DIAGNOSIS — Z87891 Personal history of nicotine dependence: Secondary | ICD-10-CM | POA: Diagnosis not present

## 2016-04-15 DIAGNOSIS — J069 Acute upper respiratory infection, unspecified: Secondary | ICD-10-CM | POA: Diagnosis not present

## 2016-04-15 DIAGNOSIS — R05 Cough: Secondary | ICD-10-CM | POA: Diagnosis present

## 2016-04-15 MED ORDER — DEXTROMETHORPHAN-GUAIFENESIN 15-400 MG PO TABS: 1.0000 | ORAL_TABLET | ORAL | 0 refills | Status: DC | PRN

## 2016-04-15 MED ORDER — DOXYCYCLINE HYCLATE 100 MG PO CAPS: 100.0000 mg | ORAL_CAPSULE | Freq: Two times a day (BID) | ORAL | 0 refills | Status: DC

## 2016-04-15 MED ORDER — BENZONATATE 100 MG PO CAPS: 200.0000 mg | ORAL_CAPSULE | Freq: Two times a day (BID) | ORAL | 0 refills | Status: DC | PRN

## 2016-04-15 MED ORDER — PREDNISONE 20 MG PO TABS: 40.0000 mg | ORAL_TABLET | Freq: Every day | ORAL | 0 refills | Status: DC

## 2016-04-15 MED ORDER — ALBUTEROL SULFATE HFA 108 (90 BASE) MCG/ACT IN AERS: 1.0000 | INHALATION_SPRAY | RESPIRATORY_TRACT | 0 refills | Status: DC | PRN

## 2016-04-15 NOTE — ED Notes (Signed)
[facility] coupon 228 given.

## 2016-04-15 NOTE — ED Notes (Signed)
Pt is in no distress. Cough x 3 weeks with anterior CP with cough.

## 2016-04-15 NOTE — ED Triage Notes (Signed)
Patient complains of 3 weeks of cough and congestion. States that she thought it was resolving but got worse, NAD. smoker

## 2016-04-15 NOTE — ED Provider Notes (Signed)
[facility] Provider Note   CSN: [id number] Arrival date & time: 04/15/16  [id number]  By signing my name below, I, [name], attest that this documentation has been prepared under the direction and in the presence of Arthor CaptainAbigail Ahria Slappey, PA-C. Electronically Signed: [name], Scribe. 04/15/2016. 9:24 AM. History   Chief Complaint Chief Complaint  Patient presents with  . Cough     The history is provided by the patient. No language interpreter was used.    HPI Comments:  Sandra Meyer is a 35 y.o. female who presents to the Emergency Department complaining of a persistent cough x 3 weeks. She reports associated nasal congestion, and generalized body aches. She notes her symptoms improved 1 week ago but have recently worsened . No OTC meds tried. No alleviating factors noted. No fever, chills, or wheezing. Pt is a current smoker.   Past Medical History:  Diagnosis Date  . AMA (advanced maternal age) multigravida 35+   . Anemia     Patient Active Problem List   Diagnosis Date Noted  . Post-dates pregnancy 02/20/2014  . Group B Streptococcus urinary tract infection complicating pregnancy in second trimester 08/31/2013  . Supervision of normal subsequent pregnancy 08/27/2013  . Previous cesarean delivery affecting pregnancy, antepartum 08/27/2013    Past Surgical History:  Procedure Laterality Date  . ABDOMINAL HYSTERECTOMY    . CESAREAN SECTION    . TONSILLECTOMY    . TUBAL LIGATION Bilateral 02/21/2014   Procedure: POST PARTUM TUBAL LIGATION;  Surgeon: Catalina AntiguaPeggy Constant, MD;  Location: [facility];  Service: Gynecology;  Laterality: Bilateral;    OB History    Gravida Para Term Preterm AB Living   3 2 2   1 2    SAB TAB Ectopic Multiple Live Births   1       2       Home Medications    Prior to Admission medications   Medication Sig Start Date End Date Taking? Authorizing Provider  calcium carbonate (TUMS - DOSED IN MG ELEMENTAL CALCIUM) 500 MG chewable tablet  Chew 1 tablet by mouth daily.    Historical Provider, MD  cephALEXin (KEFLEX) 500 MG capsule Take 1 capsule (500 mg total) by mouth 2 (two) times daily. 12/17/15   Marlon Peliffany Greene, PA-C  Dextromethorphan-Guaifenesin 15-400 MG TABS Take 1 tablet by mouth every 4 (four) hours as needed. 04/15/16   Arthor CaptainAbigail Georganna Maxson, PA-C  doxycycline (VIBRAMYCIN) 100 MG capsule Take 1 capsule (100 mg total) by mouth 2 (two) times daily. 04/15/16   Arthor CaptainAbigail Charnese Federici, PA-C  ibuprofen (ADVIL,MOTRIN) 600 MG tablet Take 1 tablet (600 mg total) by mouth every 6 (six) hours. 02/22/14   Ethelda Chickaroline Roberts, MD  lidocaine (LIDODERM) 5 % Place 1 patch onto the skin daily. Remove & Discard patch within 12 hours or as directed by MD 04/17/15   Joycie PeekBenjamin Cartner, PA-C  predniSONE (DELTASONE) 20 MG tablet Take 2 tablets (40 mg total) by mouth daily. 04/15/16   Arthor CaptainAbigail Collins Dimaria, PA-C  ranitidine (ZANTAC) 150 MG tablet Take 1 tablet (150 mg total) by mouth 2 (two) times daily. 10/21/13   Hurshel PartyLisa A Leftwich-Kirby, CNM    Family History Family History  Problem Relation Age of Onset  . Cancer Father   . Hypertension Mother   . Varicose Veins Mother     Social History Social History  Substance Use Topics  . Smoking status: Former Smoker    Types: Cigarettes    Quit date: 11/13/2012  . Smokeless tobacco: Never Used  .  Alcohol use No     Allergies   Codeine and Penicillins   Review of Systems Review of Systems  Constitutional: Negative for chills and fever.  HENT: Positive for congestion.   Respiratory: Positive for cough. Negative for wheezing.   Musculoskeletal: Positive for myalgias (generalized).     Physical Exam Updated Vital Signs BP 114/82 (BP Location: Left Arm)   Pulse 81   Temp 97.9 F (36.6 C) (Oral)   Resp 16   LMP 03/10/2015   SpO2 100%   Physical Exam  Constitutional: She is oriented to person, place, and time. She appears well-developed and well-nourished. No distress.  HENT:  Head: Normocephalic.  Right  Ear: Tympanic membrane normal.  Left Ear: Tympanic membrane normal.  Nose: No rhinorrhea.  Mouth/Throat: Posterior oropharyngeal erythema (mild) present.  Nasal congestion  No rhinorrhea  Eyes: Conjunctivae are normal.  Neck: Normal range of motion. Neck supple.  Cardiovascular: Normal rate.   Pulmonary/Chest: Effort normal. No respiratory distress.  Wheezy cough but lung sounds otherwise clear.   Abdominal: She exhibits no distension.  Musculoskeletal: Normal range of motion.  Neurological: She is alert and oriented to person, place, and time.  Skin: Skin is warm and dry.  Psychiatric: She has a normal mood and affect.  Nursing note and vitals reviewed.    ED Treatments / Results  DIAGNOSTIC STUDIES:  Oxygen Saturation is 100% on RA, normal by my interpretation.    COORDINATION OF CARE:  9:22 AM Discussed treatment plan with pt at bedside and pt agreed to plan.  Labs (all labs ordered are listed, but only abnormal results are displayed) Labs Reviewed - No data to display  EKG  EKG Interpretation None       Radiology No results found.  Procedures Procedures (including critical care time)  Medications Ordered in ED Medications - No data to display   Initial Impression / Assessment and Plan / ED Course  I have reviewed the triage vital signs and the nursing notes.  Pertinent labs & imaging results that were available during my care of the patient were reviewed by me and considered in my medical decision making (see chart for details).  Clinical Course       Pt symptoms consistent with URI.  Pt will be discharged with doxycycline as well as symptomatic treatment.  Discussed return precautions.  Pt is hemodynamically stable & in NAD prior to discharge.  Final Clinical Impressions(s) / ED Diagnoses   Final diagnoses:  Viral upper respiratory tract infection    New Prescriptions New Prescriptions   DEXTROMETHORPHAN-GUAIFENESIN 15-400 MG TABS    Take 1  tablet by mouth every 4 (four) hours as needed.   DOXYCYCLINE (VIBRAMYCIN) 100 MG CAPSULE    Take 1 capsule (100 mg total) by mouth 2 (two) times daily.   PREDNISONE (DELTASONE) 20 MG TABLET    Take 2 tablets (40 mg total) by mouth daily.   I personally performed the services described in this documentation, which was scribed in my presence. The recorded information has been reviewed and is accurate.        Arthor CaptainAbigail Naydeen Speirs, PA-C 04/15/16 [id number]    Mancel BaleElliott Wentz, MD 04/15/16 [phone]

## 2016-04-15 NOTE — Discharge Instructions (Signed)
You appear to have an upper respiratory infection (URI). An upper respiratory tract infection, or cold, is a viral infection of the air passages leading to the lungs. It is contagious and can be spread to others, especially during the first 3 or 4 days. It cannot be cured by antibiotics or other medicines. °RETURN IMMEDIATELY IF you develop shortness of breath, confusion or altered mental status, a new rash, become dizzy, faint, or poorly responsive, or are unable to be cared for at home. ° °

## 2016-07-16 ENCOUNTER — Encounter (HOSPITAL_COMMUNITY): Payer: Self-pay | Admitting: Family Medicine

## 2016-07-16 ENCOUNTER — Ambulatory Visit (HOSPITAL_COMMUNITY)
Admission: EM | Admit: 2016-07-16 | Discharge: 2016-07-16 | Disposition: A | Discharge status: Home / Self Care | Payer: Medicaid Other | Attending: Family Medicine | Admitting: Family Medicine

## 2016-07-16 DIAGNOSIS — M778 Other enthesopathies, not elsewhere classified: Secondary | ICD-10-CM

## 2016-07-16 DIAGNOSIS — G5601 Carpal tunnel syndrome, right upper limb: Secondary | ICD-10-CM

## 2016-07-16 DIAGNOSIS — M779 Enthesopathy, unspecified: Secondary | ICD-10-CM

## 2016-07-16 DIAGNOSIS — G5631 Lesion of radial nerve, right upper limb: Secondary | ICD-10-CM

## 2016-07-16 NOTE — Discharge Instructions (Signed)
Apply cold compresses often known to the wrist hand and forearm. Wear the wrist splint for the next several days. You may remove it periodically to move the wrist and fingers around. Take ibuprofen as needed for pain. Limit the use of your wrist hand and digits as these all increase the inflammation through your wrist and forearm. Call the hand surgeon for an appointment to see in 1-2 weeks.

## 2016-07-16 NOTE — ED Triage Notes (Signed)
Pt here for right hand pain and numbness shooting up right arm. sts has been going on for a while.

## 2016-07-16 NOTE — ED Provider Notes (Signed)
CSN: [id number]     Arrival date & time 07/16/16  1004 History   First MD Initiated Contact with Patient 07/16/16 1116     Chief Complaint  Patient presents with  . Hand Pain   (Consider location/radiation/quality/duration/timing/severity/associated sxs/prior Treatment) 36 year old right-hand dominant female complaining of pain, coldness, tingling and numbness primarily in the right thumb index and long fingers for a few weeks. The pain and numbness started often known several weeks ago but much worse in the past 2 weeks. 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 Pain radiates from the fingers to the wrist and radiates up the forearm. Denies any known trauma. No fall or blunt trauma.      Past Medical History:  Diagnosis Date  . AMA (advanced maternal age) multigravida 35+   . Anemia    Past Surgical History:  Procedure Laterality Date  . ABDOMINAL HYSTERECTOMY    . CESAREAN SECTION    . TONSILLECTOMY    . TUBAL LIGATION Bilateral 02/21/2014   Procedure: POST PARTUM TUBAL LIGATION;  Surgeon: Catalina Antigua, MD;  Location: [facility];  Service: Gynecology;  Laterality: Bilateral;   Family History  Problem Relation Age of Onset  . Cancer Father   . Hypertension Mother   . Varicose Veins Mother    Social History  Substance Use Topics  . Smoking status: Former Smoker    Types: Cigarettes    Quit date: 11/13/2012  . Smokeless tobacco: Never Used  . Alcohol use No   OB History    Gravida Para Term Preterm AB Living   3 2 2   1 2    SAB TAB Ectopic Multiple Live Births   1       2     Review of Systems  Constitutional: Negative for activity change, chills and fever.  HENT: Negative.   Respiratory: Negative.   Cardiovascular: Negative.   Musculoskeletal:       As per HPI  Skin: Negative for color change, pallor and rash.  Neurological: Negative.   All  other systems reviewed and are negative.   Allergies  Codeine and Penicillins  Home Medications   Prior to Admission medications   Medication Sig Start Date End Date Taking? Authorizing Provider  albuterol (PROVENTIL HFA;VENTOLIN HFA) 108 (90 Base) MCG/ACT inhaler Inhale 1-2 puffs into the lungs every 4 (four) hours as needed for wheezing or shortness of breath. 04/15/16   Abigail Harris, PA-C  benzonatate (TESSALON) 100 MG capsule Take 2 capsules (200 mg total) by mouth 2 (two) times daily as needed for cough. 04/15/16   Arthor Captain, PA-C  calcium carbonate (TUMS - DOSED IN MG ELEMENTAL CALCIUM) 500 MG chewable tablet Chew 1 tablet by mouth daily.    Historical Provider, MD  Dextromethorphan-Guaifenesin 15-400 MG TABS Take 1 tablet by mouth every 4 (four) hours as needed. 04/15/16   Arthor Captain, PA-C  doxycycline (VIBRAMYCIN) 100 MG capsule Take 1 capsule (100 mg total) by mouth 2 (two) times daily. 04/15/16   Arthor Captain, PA-C  ibuprofen (ADVIL,MOTRIN) 600 MG tablet Take 1 tablet (600 mg total) by mouth every 6 (six) hours. 02/22/14   Ethelda Chick, MD  ranitidine (ZANTAC) 150 MG tablet Take 1 tablet (150 mg total) by mouth 2 (two) times daily. 10/21/13   Hurshel Party, CNM   Meds Ordered and Administered this Visit  Medications - No data  to display  BP 114/77   Pulse 87   Resp 18   LMP 03/10/2015   SpO2 100%  No data found.   Physical Exam  Constitutional: She is oriented to person, place, and time. She appears well-developed and well-nourished.  Pulmonary/Chest: Effort normal.  Musculoskeletal: Normal range of motion. She exhibits no edema.  No obvious edema or swelling to the hand wrist or forearm. Tenderness to the volar and extensor surface of the wrist and forearm. Patient exhibits near full range of motion of the digits. Grip strength is 4 out of 5. Opposition of the thumb is intact. Positive Tinel's, positive Phalen's. Positive Finkelstein sign.   Neurological: She is alert and oriented to person, place, and time. No cranial nerve deficit.  Skin: Skin is warm and dry.  Psychiatric: She has a normal mood and affect.  Nursing note and vitals reviewed.   Urgent Care Course     Procedures (including critical care time)  Labs Review Labs Reviewed - No data to display  Imaging Review No results found.   Visual Acuity Review  Right Eye Distance:   Left Eye Distance:   Bilateral Distance:    Right Eye Near:   Left Eye Near:    Bilateral Near:         MDM   1. 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 Radial nerve compression, right   4. Tendinitis of thumb    Apply cold compresses often known to the wrist hand and forearm. Wear the wrist splint for the next several days. You may remove it periodically to move the wrist and fingers around. Take ibuprofen as needed for pain. Limit the use of your wrist hand and digits as these all increase the inflammation through your wrist and forearm. Call the hand surgeon for an appointment to see in 1-2 weeks. Wrist splint    Hayden Rasmussenavid Edwar Coe, NP 07/16/16 1140

## 2017-02-23 ENCOUNTER — Encounter (HOSPITAL_COMMUNITY): Payer: Self-pay

## 2017-02-23 ENCOUNTER — Inpatient Hospital Stay (HOSPITAL_COMMUNITY)
Admission: AD | Admit: 2017-02-23 | Discharge: 2017-02-23 | Disposition: A | Discharge status: Home / Self Care | Payer: Medicaid Other | Source: Ambulatory Visit | Attending: Obstetrics & Gynecology | Admitting: Obstetrics & Gynecology

## 2017-02-23 DIAGNOSIS — R1110 Vomiting, unspecified: Secondary | ICD-10-CM | POA: Diagnosis present

## 2017-02-23 DIAGNOSIS — IMO0001 Reserved for inherently not codable concepts without codable children: Secondary | ICD-10-CM

## 2017-02-23 DIAGNOSIS — T6291XA Toxic effect of unspecified noxious substance eaten as food, accidental (unintentional), initial encounter: Secondary | ICD-10-CM | POA: Insufficient documentation

## 2017-02-23 DIAGNOSIS — Z880 Allergy status to penicillin: Secondary | ICD-10-CM | POA: Diagnosis not present

## 2017-02-23 DIAGNOSIS — R112 Nausea with vomiting, unspecified: Secondary | ICD-10-CM | POA: Diagnosis not present

## 2017-02-23 DIAGNOSIS — Z87891 Personal history of nicotine dependence: Secondary | ICD-10-CM | POA: Insufficient documentation

## 2017-02-23 NOTE — Discharge Instructions (Signed)
Food Poisoning Food poisoning is an illness that is caused by eating or drinking contaminated foods or drinks. Food poisoning is usually mild and lasts 1-2 days. Foods and drinks can become contaminated because of:  Poor personal hygiene, such as not washing hands well enough or often.  Not storing food properly. For example, not refrigerating raw meat.  Serving, preparing, and storing food on surfaces that are not clean.  Cooking or eating with utensils that are not clean.  The most common causes of this condition are:  Viruses.  Bacteria.  Parasites.  Follow these instructions at home: Eating and drinking   Drink enough fluids to keep your pee (urine) clear or pale yellow. You may need to drink small amounts of clear liquids often.  Avoid: ? Milk. ? Caffeine. ? Alcohol.  Ask your doctor exactly how you should get enough fluid in your body (rehydrate).  Eat small meals often rather than eating large meals. Medicines  Take over-the-counter and prescription medicines only as told by your doctor. Ask your doctor if you should continue to take any of your regular prescribed and over-the-counter medicines.  If you were prescribed an antibiotic medicine, take it as told by your doctor. Do not stop taking the antibiotic even if you start to feel better. General instructions   Wash your hands fully before you prepare food and after you go to the bathroom (use the toilet). Make sure people who live with you also wash their hands often.  Clean surfaces that you touch with a product that contains chlorine bleach.  Keep all follow-up visits as told by your doctor. This is important. How is this prevented?  Wash your hands, food preparation surfaces, and utensils before and after you handle raw foods.  Use separate food preparation surfaces and storage spaces for raw meat and for fruits and vegetables.  Keep refrigerated foods colder than 40F (5C).  Serve hot foods right  away or keep them heated above 140F (60C).  Store dry foods in cool, dry spaces. Keep them away from too much heat or moisture.  Throw out any foods that: ? Do not smell right. ? Are in cans that are bulging.  Follow approved canning procedures.  Heat canned foods fully before you taste them.  Drink bottled or sterile water when you travel. Get help right away if: Call 911 or go to the emergency room if:  You have trouble: ? Breathing. ? Swallowing. ? Talking. ? Moving.  You have blurry vision.  You cannot eat or drink without throwing up (vomiting).  You pass out (faint).  Your eyes turn yellow.  You continue to throw up or have watery poop (diarrhea).  You start to have pain in your belly (abdomen), your belly pain gets worse, or your belly pain stays in one small area.  You have a fever.  You have blood or mucus in your poop (stools), or your poop looks dark black and tarry.  You have signs of dehydration, such as: ? Dark pee, very little pee, or no pee. ? Cracked lips. ? Not making tears while crying. ? Dry mouth. ? Sunken eyes. ? Sleepiness. ? Weakness. ? Dizziness.  This information is not intended to replace advice given to you by your health care provider. Make sure you discuss any questions you have with your health care provider. Document Released: 10/25/2009 Document Revised: 10/13/2015 Document Reviewed: 11/08/2014 Elsevier Interactive Patient Education  2018 Elsevier Inc.  

## 2017-02-23 NOTE — MAU Note (Signed)
Patient reports having a tick bite in July, ate a hamburger last night, within 20 to 30 minutes she started sweating, vomiting, and migraine.

## 2017-02-23 NOTE — MAU Provider Note (Signed)
History     CSN: [id number]  Arrival date and time: 02/23/17 1044   None     Chief Complaint  Patient presents with  . Emesis   HPI   [name] is a 36 y.o. 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 States over the last few months every time she eats red meat she does not feel well following. States last night she ate cook out and started vomiting shortly after that. States she had a tick bite back in July and ever since then she feels she had devolved an allergy to red meat.  States she has vomited multiple times throughout the night. She states she has not taken anything for the symptoms.   OB History    Gravida Para Term Preterm AB Living   SAB TAB Ectopic Multiple Live Births   1       2      Past Medical History:  Diagnosis Date  . AMA (advanced maternal age) multigravida 35+   . Anemia     Past Surgical History:  Procedure Laterality Date  . ABDOMINAL HYSTERECTOMY    . CESAREAN SECTION    . TONSILLECTOMY    . TUBAL LIGATION Bilateral 02/21/2014   Procedure: POST PARTUM TUBAL LIGATION;  Surgeon: Catalina Antigua, MD;  Location: [facility];  Service: Gynecology;  Laterality: Bilateral;    Family History  Problem Relation Age of Onset  . Cancer Father   . Hypertension Mother   . Varicose Veins Mother     Social History  Substance Use Topics  . Smoking status: Former Smoker    Types: Cigarettes    Quit date: 11/13/2012  . Smokeless tobacco: Never Used  . Alcohol use No    Allergies:  Allergies  Allergen Reactions  . Codeine Anaphylaxis  . Penicillins Nausea And Vomiting and Other (See Comments)    Throat burning. Able to to Keflex.    Prescriptions Prior to Admission  Medication Sig Dispense Refill Last Dose  . albuterol (PROVENTIL HFA;VENTOLIN HFA) 108 (90 Base) MCG/ACT inhaler Inhale 1-2 puffs into the lungs every 4 (four) hours as needed for wheezing or shortness of breath. 1 Inhaler 0   . benzonatate (TESSALON)  100 MG capsule Take 2 capsules (200 mg total) by mouth 2 (two) times daily as needed for cough. 20 capsule 0   . calcium carbonate (TUMS - DOSED IN MG ELEMENTAL CALCIUM) 500 MG chewable tablet Chew 1 tablet by mouth daily.   Past Month at Unknown time  . Dextromethorphan-Guaifenesin 15-400 MG TABS Take 1 tablet by mouth every 4 (four) hours as needed. 30 each 0   . doxycycline (VIBRAMYCIN) 100 MG capsule Take 1 capsule (100 mg total) by mouth 2 (two) times daily. 20 capsule 0   . ibuprofen (ADVIL,MOTRIN) 600 MG tablet Take 1 tablet (600 mg total) by mouth every 6 (six) hours. 30 tablet 0   . ranitidine (ZANTAC) 150 MG tablet Take 1 tablet (150 mg total) by mouth 2 (two) times daily. 60 tablet 3 Past Month at Unknown time   Review of Systems  Constitutional: Negative for fever.  Gastrointestinal: Positive for nausea and vomiting. Negative for abdominal pain and diarrhea.   Physical Exam   Blood pressure 125/79, pulse 93, temperature 97.9 F (36.6 C), temperature source Oral, resp. rate 16, height  (1.803 m), weight 170 lb 1.3 oz (77.1 kg), last menstrual period 02/18/2015, currently breastfeeding.  Physical Exam  Constitutional: She is oriented to person, place, and time. Vital signs are normal. She appears well-developed and well-nourished.  Non-toxic appearance. She does not have a sickly appearance. She does not appear ill. No distress.  HENT:  Head: Normocephalic.  Musculoskeletal: Normal range of motion.  Neurological: She is alert and oriented to person, place, and time.  Skin: Skin is warm. She is not diaphoretic.  Psychiatric: Her behavior is normal.   MAU Course  Procedures  None  MDM  Patient stable for discharge. No emergent issue present. Suggested a PCP. Patient offered Zofran, however refused.   Assessment and Plan   A:  1. Food poisoning, accidental or unintentional, initial encounter     P:  Discharge home in stable condition Return to MAU for OBGYN  emergencies only Follow up with PCP   Rasch, Harolyn Rutherford, NP 02/23/2017 11:18 AM

## 2017-03-01 ENCOUNTER — Ambulatory Visit (INDEPENDENT_AMBULATORY_CARE_PROVIDER_SITE_OTHER): Payer: Medicaid Other | Admitting: Obstetrics and Gynecology

## 2017-03-01 ENCOUNTER — Encounter: Payer: Self-pay | Admitting: Obstetrics and Gynecology

## 2017-03-01 ENCOUNTER — Other Ambulatory Visit (HOSPITAL_COMMUNITY)
Admission: RE | Admit: 2017-03-01 | Discharge: 2017-03-01 | Disposition: A | Discharge status: Home / Self Care | Payer: Medicaid Other | Source: Ambulatory Visit | Attending: Obstetrics and Gynecology | Admitting: Obstetrics and Gynecology

## 2017-03-01 VITALS — BP 124/79 | HR 60 | Ht 71.0 in | Wt 170.0 lb

## 2017-03-01 DIAGNOSIS — N939 Abnormal uterine and vaginal bleeding, unspecified: Secondary | ICD-10-CM | POA: Insufficient documentation

## 2017-03-01 DIAGNOSIS — Z202 Contact with and (suspected) exposure to infections with a predominantly sexual mode of transmission: Secondary | ICD-10-CM | POA: Diagnosis not present

## 2017-03-01 DIAGNOSIS — Z01419 Encounter for gynecological examination (general) (routine) without abnormal findings: Secondary | ICD-10-CM

## 2017-03-01 DIAGNOSIS — N941 Unspecified dyspareunia: Secondary | ICD-10-CM | POA: Insufficient documentation

## 2017-03-01 MED ORDER — NITROFURANTOIN MONOHYD MACRO 100 MG PO CAPS: 100.0000 mg | ORAL_CAPSULE | Freq: Two times a day (BID) | ORAL | 0 refills | Status: DC

## 2017-03-01 NOTE — Progress Notes (Signed)
Sandra Meyer is a 36 y.o. [phone] female here for a routine annual gynecologic exam.  She reports pain with IC over the last three years. Position changes do not help. Same partner for @ 6 yrs. She also also noted that here cycles have become irregular. Last from 7-10 days, occur monthly but not the same time each month. Her cycles are painful with cramps and clots. Uses pads and has to change pads hourly when cycles are heavy.     Gynecologic History Patient's last menstrual period was 02/22/2017 (exact date). Contraception: tubal ligation Last Pap: 16. Results were: normal Last mammogram: NA. Results were: NA  Obstetric History OB History  Gravida Para Term Preterm AB Living  SAB TAB Ectopic Multiple Live Births  1       2    # Outcome Date GA Lbr Len/2nd Weight Sex Delivery Anes PTL Lv  3 Term 02/20/14 [redacted]w[redacted]d 10:02 / 00:37 6 lb 13.2 oz (3.095 kg) M VBAC EPI, Local  LIV  2 SAB 2014          1 Term 05/02/09 [redacted]w[redacted]d 26:00  M CS-LTranv EPI  LIV     Birth Comments: Induced because ROM and did go into spontaneous labor, c/s for ftp past 1cm. Born at [facility]       Past Medical History:  Diagnosis Date  . AMA (advanced maternal age) multigravida 35+   . Anemia     Past Surgical History:  Procedure Laterality Date  . ABDOMINAL HYSTERECTOMY    . CESAREAN SECTION    . TONSILLECTOMY    . TUBAL LIGATION Bilateral 02/21/2014   Procedure: POST PARTUM TUBAL LIGATION;  Surgeon: Catalina Antigua, MD;  Location: [facility];  Service: Gynecology;  Laterality: Bilateral;    No current outpatient prescriptions on file prior to visit.   No current facility-administered medications on file prior to visit.     Allergies  Allergen Reactions  . Codeine Anaphylaxis  . Penicillins Nausea And Vomiting and Other (See Comments)    Throat burning. Able to to Keflex.    Social History   Social History  . Marital status: Single    Spouse name: N/A  . Number of children: N/A   . Years of education: N/A   Occupational History  . Not on file.   Social History Main Topics  . Smoking status: Former Smoker    Types: Cigarettes    Quit date: 11/13/2012  . Smokeless tobacco: Never Used  . Alcohol use No  . Drug use: No  . Sexual activity: Yes    Birth control/ protection: None   Other Topics Concern  . Not on file   Social History Narrative  . No narrative on file    Family History  Problem Relation Age of Onset  . Cancer Father   . Hypertension Mother   . Varicose Veins Mother     The following portions of the patient's history were reviewed and updated as appropriate: allergies, current medications, past family history, past medical history, past social history, past surgical history and problem list.  Review of Systems Pertinent items noted in HPI and remainder of comprehensive ROS otherwise negative.   Objective:  BP 124/79   Pulse 60   Ht  (1.803 m)   Wt 170 lb (77.1 kg)   LMP 02/22/2017 (Exact Date)   Breastfeeding? No   BMI 23.71 kg/m  CONSTITUTIONAL: Well-developed, well-nourished female in no  acute distress.  HENT:  Normocephalic, atraumatic, External right and left ear normal. Oropharynx is clear and moist EYES: Conjunctivae and EOM are normal. Pupils are equal, round, and reactive to light. No scleral icterus.  NECK: Normal range of motion, supple, no masses.  Normal thyroid.  SKIN: Skin is warm and dry. No rash noted. Not diaphoretic. No erythema. No pallor. NEUROLGIC: Alert and oriented to person, place, and time. Normal reflexes, muscle tone coordination. No cranial nerve deficit noted. PSYCHIATRIC: Normal mood and affect. Normal behavior. Normal judgment and thought content. CARDIOVASCULAR: Normal heart rate noted, regular rhythm RESPIRATORY: Clear to auscultation bilaterally. Effort and breath sounds normal, no problems with respiration noted. BREASTS: Symmetric in size. No masses, skin changes, nipple drainage, or  lymphadenopathy. ABDOMEN: Soft, normal bowel sounds, no distention noted.  No tenderness, rebound or guarding.  PELVIC: Normal appearing external genitalia; normal appearing vaginal mucosa and cervix.  No abnormal discharge noted.  Pap smear obtained.  Normal uterine size, no other palpable masses, no uterine or adnexal tenderness. Bladder tenderness MUSCULOSKELETAL: Normal range of motion. No tenderness.  No cyanosis, clubbing, or edema.  2+ distal pulses.   Assessment:  Annual gynecologic examination with pap smear  Dyspareunia DUB Plan:  Will follow up results of pap smear and manage accordingly. Routine preventative health maintenance measures emphasized. Will check GYN U/S for DUB and Dyspareunia. I suspect the pts dyspareunia is related to chronic bladder inflammation. Will treat with Macrobid bid x 7 days and then 1 po qhs x 14 days F/U in 6-8 weeks to evaluate response to Macrobid and review U/S results.  Please refer to After Visit Summary for other counseling recommendations.    Hermina Staggers, MD, FACOG Attending Obstetrician & Gynecologist Center for [facility], [facility]

## 2017-03-01 NOTE — Patient Instructions (Signed)

## 2017-03-03 LAB — URINE CULTURE

## 2017-03-05 LAB — CYTOLOGY - PAP
Adequacy: ABSENT
Chlamydia: NEGATIVE
Diagnosis: NEGATIVE
HPV (WINDOPATH): NOT DETECTED
Neisseria Gonorrhea: NEGATIVE
Trichomonas: NEGATIVE

## 2017-03-07 ENCOUNTER — Other Ambulatory Visit: Payer: Self-pay

## 2017-03-07 ENCOUNTER — Ambulatory Visit (HOSPITAL_COMMUNITY)
Admission: RE | Admit: 2017-03-07 | Discharge: 2017-03-07 | Disposition: A | Discharge status: Home / Self Care | Payer: Medicaid Other | Source: Ambulatory Visit | Attending: Obstetrics and Gynecology | Admitting: Obstetrics and Gynecology

## 2017-03-07 DIAGNOSIS — N941 Unspecified dyspareunia: Secondary | ICD-10-CM

## 2017-03-07 MED ORDER — NITROFURANTOIN MONOHYD MACRO 100 MG PO CAPS: 100.0000 mg | ORAL_CAPSULE | Freq: Two times a day (BID) | ORAL | 0 refills | Status: DC
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
## 2017-03-08 ENCOUNTER — Telehealth: Payer: Self-pay | Admitting: General Practice
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
## 2017-04-03 ENCOUNTER — Telehealth: Payer: Self-pay | Admitting: *Deleted

## 2017-04-03 NOTE — Telephone Encounter (Signed)
Fax received from pharmacy requesting clarification of dosing instructions for Macrobid Rx. I returned the call and provided the following information: 1 tablet po twice daily x1 week, then 1 tablet po daily @ HS x14 days. Pharmacist stated that pt has npt picked up the medication. I called pt and left message requesting her to call back and to leave a message stating whether detailed information can be left on her voicemail. Need to find out why pt has not picked up medication as prescribed by Dr. Alysia PennaErvin.

## 2017-04-07 NOTE — Telephone Encounter (Signed)
Patient left message on nurse voicemail on 04/04/17 at 9;39 am, pt stated that it is ok to leave detailed messages on her voicemail. 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

## 2017-04-23 NOTE — Telephone Encounter (Signed)
Called pt and she stated that she has obtained her medication and is taking as directed.  She had no further questions.

## 2019-04-04 ENCOUNTER — Ambulatory Visit
Admission: EM | Admit: 2019-04-04 | Discharge: 2019-04-04 | Disposition: A | Discharge status: Home / Self Care | Payer: Medicaid Other | Attending: Physician Assistant | Admitting: Physician Assistant

## 2019-04-04 ENCOUNTER — Encounter: Payer: Self-pay | Admitting: Emergency Medicine

## 2019-04-04 ENCOUNTER — Other Ambulatory Visit: Payer: Self-pay

## 2019-04-04 DIAGNOSIS — R0789 Other chest pain: Secondary | ICD-10-CM

## 2019-04-04 DIAGNOSIS — N644 Mastodynia: Secondary | ICD-10-CM | POA: Diagnosis not present

## 2019-04-04 DIAGNOSIS — F17210 Nicotine dependence, cigarettes, uncomplicated: Secondary | ICD-10-CM | POA: Diagnosis not present

## 2019-04-04 HISTORY — DX: Allergy to other foods: Z91.018

## 2019-04-04 MED ORDER — MELOXICAM 7.5 MG PO TABS: 7.5000 mg | ORAL_TABLET | Freq: Every day | ORAL | 0 refills | Status: DC

## 2019-04-04 NOTE — ED Notes (Signed)
Patient not in lobby when this RN went out to call for appointment.  Registration staff states patient went to car for debit card.  Will call again shortly.

## 2019-04-04 NOTE — ED Triage Notes (Addendum)
Pt presents to [facility] for assessment of right sided axillar/chest/breast pain x 2-3 weeks.  Patient states they are sharp and intermittent.  Patient also states for a few years her right hand has been going numb.  Also c/o a lump to her pubic bone area.  Pt c/o SOB starting today when the pain flares up.  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

## 2019-04-04 NOTE — ED Provider Notes (Signed)
[facility]    CSN: [id number] Arrival date & time: 04/04/19  1211      History   Chief Complaint Chief Complaint  Patient presents with  . APPT: 1230  . Chest Pain    HPI Sandra Meyer is a 38 y.o. female.   38 year old female comes in for 2-3 week history of right axillary/breast/chest pain. Pain is intermittent, sharp pain,  worse with movement, no pain at rest. Denies injury/trauma. Denies rash, erythema, warmth. Heavy lifting. Cough in the morning without other URI symptoms. Feels short of breath due to the pain. Denies leg swelling. Right hand numbness that is chronic, intermittent, no obvious worsening in the past 2-3 weeks. Has felt decreased grip strength. Denies fever, chills, body aches. Denies long travels, leg swelling, OCPs, personal history of blood clots. Multiple family history of cancer with associated DVT/PEs. Aunt with breast cancer, ?premenapausal. Patient denies personal history of breast cancer. Denies nipple discharge, changes in skin texture, breast mass.      Past Medical History:  Diagnosis Date  . Allergy to alpha-gal   . AMA (advanced maternal age) multigravida 35+   . Anemia     Patient Active Problem List   Diagnosis Date Noted  . Possible exposure to STD 03/01/2017  . Dyspareunia in female 03/01/2017  . Abnormal uterine bleeding (AUB) 03/01/2017    Past Surgical History:  Procedure Laterality Date  . CESAREAN SECTION    . TONSILLECTOMY    . TUBAL LIGATION Bilateral 02/21/2014   Procedure: POST PARTUM TUBAL LIGATION;  Surgeon: Catalina Antigua, MD;  Location: [facility];  Service: Gynecology;  Laterality: Bilateral;    OB History    Gravida  3   Para  2   Term  2   Preterm      AB  1   Living  2     SAB  1   TAB      Ectopic      Multiple      Live Births  2            Home Medications    Prior to Admission medications   Medication Sig Start Date End Date Taking? Authorizing Provider  meloxicam  (MOBIC) 7.5 MG tablet Take 1 tablet (7.5 mg total) by mouth daily. 04/04/19   Belinda Fisher, PA-C    Family History Family History  Problem Relation Age of Onset  . Cancer Father   . Hypertension Mother   . Varicose Veins Mother     Social History Social History   Tobacco Use  . Smoking status: Current Every Day Smoker    Packs/day: 0.25    Types: Cigarettes    Last attempt to quit: 11/13/2012    Years since quitting: 6.3  . Smokeless tobacco: Never Used  Substance Use Topics  . Alcohol use: No  . Drug use: No     Allergies   Codeine, Penicillins, and Alpha-gal   Review of Systems Review of Systems  Reason unable to perform ROS: See HPI as above.     Physical Exam Triage Vital Signs ED Triage Vitals  Enc Vitals Group     BP 04/04/19 1228 137/90     Pulse Rate 04/04/19 1228 68     Resp 04/04/19 1228 16     Temp 04/04/19 1228 98 F (36.7 C)     Temp Source 04/04/19 1228 Oral     SpO2 04/04/19 1228 98 %  Weight --      Height --      Head Circumference --      Peak Flow --      Pain Score 04/04/19 1235 7     Pain Loc --      Pain Edu? --      Excl. in GC? --    No data found.  Updated Vital Signs BP 137/90 (BP Location: Left Arm)   Pulse 68   Temp 98 F (36.7 C) (Oral)   Resp 16   SpO2 98%   Physical Exam Exam conducted with a chaperone present.  Constitutional:      General: She is not in acute distress.    Appearance: Normal appearance. She is well-developed. She is not ill-appearing, toxic-appearing or diaphoretic.  HENT:     Head: Normocephalic and atraumatic.     Mouth/Throat:     Mouth: Mucous membranes are moist.     Pharynx: Oropharynx is clear. Uvula midline.  Eyes:     Conjunctiva/sclera: Conjunctivae normal.     Pupils: Pupils are equal, round, and reactive to light.  Neck:     Musculoskeletal: Normal range of motion and neck supple.  Cardiovascular:     Rate and Rhythm: Normal rate and regular rhythm.     Heart sounds: Normal  heart sounds. No murmur. No friction rub. No gallop.   Pulmonary:     Effort: Pulmonary effort is normal. No accessory muscle usage, prolonged expiration, respiratory distress or retractions.     Breath sounds: Normal breath sounds. No wheezing or rales.     Comments: Lungs clear to auscultation without adventitious lung sounds. Chest:       Comments: No skin changes, rashes, erythema, warmth, swelling. Tenderness to palpation diffusely of axilla, right lateral chest/breast. No obvious axillary lymphadenopathy. No abscess felt.  Breast asymmetric. No obvious mass felt. No skin changes, inverted nipple.  Abdominal:     General: Bowel sounds are normal.     Palpations: Abdomen is soft.     Tenderness: There is no abdominal tenderness. There is no right CVA tenderness, left CVA tenderness, guarding or rebound.  Musculoskeletal:     Comments: No rashes, erythema, warmth, contusion seen.  No tenderness to palpation of spinous processes.  Mild tenderness to palpation along right lower thoracic region.  No tenderness to palpation of the shoulder.  Full range of motion of neck, back, shoulder, elbow, wrist, fingers.  Strength normal ankle bilaterally.  Sensation intact ankle bilaterally.  Radial pulse 2+, cap refill less than 2 seconds.  Skin:    General: Skin is warm and dry.  Neurological:     General: No focal deficit present.     Mental Status: She is alert and oriented to person, place, and time.  Psychiatric:        Behavior: Behavior normal.        Judgment: Judgment normal.      UC Treatments / Results  Labs (all labs ordered are listed, but only abnormal results are displayed) Labs Reviewed - No data to display  EKG   Radiology No results found.  Procedures Procedures (including critical care time)  Medications Ordered in UC Medications - No data to display  Initial Impression / Assessment and Plan / UC Course  I have reviewed the triage vital signs and the nursing  notes.  Pertinent labs & imaging results that were available during my care of the patient were reviewed by me and considered in my  medical decision making (see chart for details).    No alarming signs on exam.  Patient job requires repetitive motion of the right shoulder, ?  MSK pain versus breast pain.  Will start NSAIDs, rest, ice compress at this time for possible MSK pain.  Given patient at 38 years old without history of mammogram, will also order diagnostic mammogram with ultrasound if needed for right breast pain.  Orders placed.  Return precautions given.  Otherwise patient to follow-up with PCP/OB/GYN for further evaluation and management needed.  Patient expresses understanding and agrees to plan.  Final Clinical Impressions(s) / UC Diagnoses   Final diagnoses:  Breast pain, right  Right-sided chest wall pain    ED Prescriptions    Medication Sig Dispense Auth. Provider   meloxicam (MOBIC) 7.5 MG tablet Take 1 tablet (7.5 mg total) by mouth daily. 15 tablet Ok Edwards, PA-C     PDMP not reviewed this encounter.   Ok Edwards, PA-C 04/04/19 1846

## 2019-04-04 NOTE — ED Notes (Signed)
Patient able to ambulate independently  

## 2019-04-04 NOTE — Discharge Instructions (Signed)
Start Mobic. Do not take ibuprofen (motrin/advil)/ naproxen (aleve) while on mobic.  Ice compress, rest.  Follow-up for mammogram/ultrasound as needed.  Follow-up with PCP/OB/GYN for further evaluation if symptoms not improving.  If sudden worsening of shortness of breath, chest pain, weakness, dizziness, go to the emergency department for further evaluation needed.

## 2019-04-06 ENCOUNTER — Other Ambulatory Visit: Payer: Self-pay | Admitting: Physician Assistant

## 2019-08-15 ENCOUNTER — Ambulatory Visit: Payer: Medicaid Other | Attending: Internal Medicine

## 2019-08-15 DIAGNOSIS — Z23 Encounter for immunization: Secondary | ICD-10-CM

## 2019-08-15 NOTE — Progress Notes (Signed)
   Covid-19 Vaccination Clinic  Name:  Sandra Meyer    MRN: [id number] DOB: [birth year]  08/15/2019  [name] was observed post Covid-19 immunization for 30 minutes based on pre-vaccination screening without incident. She was provided with Vaccine Information Sheet and instruction to access the V-Safe system.   [name] was instructed to call 911 with any severe reactions post vaccine: Marland Kitchen Difficulty breathing  . Swelling of face and throat  . A fast heartbeat  . A bad rash all over body  . Dizziness and weakness   Immunizations Administered    Name Date Dose VIS Date Route   Pfizer COVID-19 Vaccine 08/15/2019 11:41 AM 0.3 mL 05/01/2019 Intramuscular   Manufacturer: [employer], [employer]   Lot: [id number]   NDC: [address]-5

## 2019-09-08 ENCOUNTER — Ambulatory Visit: Payer: Medicaid Other | Attending: Internal Medicine

## 2019-09-08 DIAGNOSIS — Z23 Encounter for immunization: Secondary | ICD-10-CM

## 2019-09-08 NOTE — Progress Notes (Signed)
   Covid-19 Vaccination Clinic  Name:  Sandra Meyer    MRN: [id number] DOB: Nov 07, 1980  09/08/2019  Sandra Meyer was observed post Covid-19 immunization for 30 minutes without incident. She was provided with Vaccine Information Sheet and instruction to access the V-Safe system.   Sandra Meyer was instructed to call 911 with any severe reactions post vaccine: Marland Kitchen Difficulty breathing  . Swelling of face and throat  . A fast heartbeat  . A bad rash all over body  . Dizziness and weakness   Immunizations Administered    Name Date Dose VIS Date Route   Pfizer COVID-19 Vaccine 09/08/2019 10:42 AM 0.3 mL 07/15/2018 Intramuscular   Manufacturer: [employer], [employer]   Lot: [id number]   NDC: [address]-2

## 2019-11-19 DIAGNOSIS — Z419 Encounter for procedure for purposes other than remedying health state, unspecified: Secondary | ICD-10-CM | POA: Diagnosis not present

## 2019-12-20 DIAGNOSIS — Z419 Encounter for procedure for purposes other than remedying health state, unspecified: Secondary | ICD-10-CM | POA: Diagnosis not present

## 2019-12-24 DIAGNOSIS — T7840XA Allergy, unspecified, initial encounter: Secondary | ICD-10-CM | POA: Diagnosis not present

## 2019-12-25 DIAGNOSIS — E7409 Other glycogen storage disease: Secondary | ICD-10-CM | POA: Diagnosis not present

## 2019-12-25 DIAGNOSIS — J069 Acute upper respiratory infection, unspecified: Secondary | ICD-10-CM | POA: Diagnosis not present

## 2019-12-25 DIAGNOSIS — T50905A Adverse effect of unspecified drugs, medicaments and biological substances, initial encounter: Secondary | ICD-10-CM | POA: Diagnosis not present

## 2020-01-20 DIAGNOSIS — Z419 Encounter for procedure for purposes other than remedying health state, unspecified: Secondary | ICD-10-CM | POA: Diagnosis not present

## 2020-02-19 DIAGNOSIS — Z419 Encounter for procedure for purposes other than remedying health state, unspecified: Secondary | ICD-10-CM | POA: Diagnosis not present

## 2020-03-07 DIAGNOSIS — Z91018 Allergy to other foods: Secondary | ICD-10-CM | POA: Diagnosis not present

## 2020-03-21 DIAGNOSIS — Z419 Encounter for procedure for purposes other than remedying health state, unspecified: Secondary | ICD-10-CM | POA: Diagnosis not present

## 2020-04-20 DIAGNOSIS — Z419 Encounter for procedure for purposes other than remedying health state, unspecified: Secondary | ICD-10-CM | POA: Diagnosis not present

## 2020-05-21 DIAGNOSIS — Z419 Encounter for procedure for purposes other than remedying health state, unspecified: Secondary | ICD-10-CM | POA: Diagnosis not present

## 2020-06-18 DIAGNOSIS — Z20822 Contact with and (suspected) exposure to covid-19: Secondary | ICD-10-CM | POA: Diagnosis not present

## 2020-06-21 DIAGNOSIS — Z419 Encounter for procedure for purposes other than remedying health state, unspecified: Secondary | ICD-10-CM | POA: Diagnosis not present

## 2020-06-29 ENCOUNTER — Ambulatory Visit
Admit: 2020-06-29 | Discharge: 2020-06-30 | Payer: PRIVATE HEALTH INSURANCE | Attending: Allergy & Immunology | Primary: Allergy & Immunology

## 2020-06-29 DIAGNOSIS — Z91014 Allergy to mammalian meats: Secondary | ICD-10-CM | POA: Diagnosis not present

## 2020-06-29 DIAGNOSIS — W57XXXS Bitten or stung by nonvenomous insect and other nonvenomous arthropods, sequela: Secondary | ICD-10-CM | POA: Diagnosis not present

## 2020-06-29 DIAGNOSIS — K5229 Other allergic and dietetic gastroenteritis and colitis: Secondary | ICD-10-CM | POA: Diagnosis not present

## 2020-06-29 MED ORDER — CROMOLYN 100 MG/5 ML ORAL CONCENTRATE
Freq: Four times a day (QID) | ORAL | 12 refills | 12.00000 days | Status: CP | PRN
Start: 2020-06-29 — End: 2021-06-29

## 2020-07-19 DIAGNOSIS — Z419 Encounter for procedure for purposes other than remedying health state, unspecified: Secondary | ICD-10-CM | POA: Diagnosis not present

## 2020-07-19 DIAGNOSIS — K5229 Other allergic and dietetic gastroenteritis and colitis: Principal | ICD-10-CM

## 2020-07-19 MED ORDER — CROMOLYN 100 MG/5 ML ORAL CONCENTRATE
Freq: Four times a day (QID) | ORAL | 12 refills | 12 days | Status: CP | PRN
Start: 2020-07-19 — End: 2021-07-19

## 2020-07-20 MED ORDER — KETOTIFEN FUMARATE (BULK) 100 % POWDER
Freq: Two times a day (BID) | ORAL | 3 refills | 0 days | Status: CP
Start: 2020-07-20 — End: ?

## 2020-08-19 DIAGNOSIS — Z419 Encounter for procedure for purposes other than remedying health state, unspecified: Secondary | ICD-10-CM | POA: Diagnosis not present

## 2020-09-18 ENCOUNTER — Ambulatory Visit
Admission: EM | Admit: 2020-09-18 | Discharge: 2020-09-18 | Disposition: A | Discharge status: Home / Self Care | Payer: Medicaid Other | Attending: Emergency Medicine | Admitting: Emergency Medicine

## 2020-09-18 ENCOUNTER — Other Ambulatory Visit: Payer: Self-pay

## 2020-09-18 ENCOUNTER — Encounter: Payer: Self-pay | Admitting: Emergency Medicine

## 2020-09-18 DIAGNOSIS — S80261A Insect bite (nonvenomous), right knee, initial encounter: Secondary | ICD-10-CM

## 2020-09-18 DIAGNOSIS — W57XXXA Bitten or stung by nonvenomous insect and other nonvenomous arthropods, initial encounter: Secondary | ICD-10-CM

## 2020-09-18 DIAGNOSIS — Z419 Encounter for procedure for purposes other than remedying health state, unspecified: Secondary | ICD-10-CM | POA: Diagnosis not present

## 2020-09-18 MED ORDER — TRIAMCINOLONE ACETONIDE 0.1 % EX CREA: 1.0000 "application " | TOPICAL_CREAM | Freq: Two times a day (BID) | CUTANEOUS | 0 refills | Status: DC

## 2020-09-18 MED ORDER — DOXYCYCLINE HYCLATE 100 MG PO CAPS: 100.0000 mg | ORAL_CAPSULE | Freq: Two times a day (BID) | ORAL | 0 refills | Status: AC

## 2020-09-18 NOTE — Discharge Instructions (Addendum)
Begin doxycycline twice daily for 10 days Warm compresses to area knee May use triamcinolone twice daily on areas of tick bites further help with itching Please read attached on tickborne illnesses and follow-up if developing any symptoms over the next 1-2 months.

## 2020-09-18 NOTE — ED Provider Notes (Signed)
[facility]    CSN: [id number] Arrival date & time: 09/18/20  0931      History   Chief Complaint Chief Complaint  Patient presents with  . Tick Removal    HPI Sandra Meyer is a 40 y.o. female presenting today for evaluation of tick bite.  Reports tick bite to right leg with associated bruising.  Tick was removed approximate 1.5 weeks ago.  Has developed some bruising and bull's-eye pattern with associated pain.  Denies other rashes.  Reports frequent tick bites and has had prior tick bites to abdomen which she has all removed.  She denies any headaches, vision changes, dizziness, lightheadedness, nausea vomiting diarrhea or abdominal pain out of the ordinary for her.  Reports history of alpha gal and has to avoid mammalian products/glycerin/gelatin.  HPI  Past Medical History:  Diagnosis Date  . Allergy to alpha-gal   . AMA (advanced maternal age) multigravida 35+   . Anemia     Patient Active Problem List   Diagnosis Date Noted  . Possible exposure to STD 03/01/2017  . Dyspareunia in female 03/01/2017  . Abnormal uterine bleeding (AUB) 03/01/2017    Past Surgical History:  Procedure Laterality Date  . CESAREAN SECTION    . TONSILLECTOMY    . TUBAL LIGATION Bilateral 02/21/2014   Procedure: POST PARTUM TUBAL LIGATION;  Surgeon: Catalina Antigua, MD;  Location: [facility];  Service: Gynecology;  Laterality: Bilateral;    OB History    Gravida  3   Para  2   Term  2   Preterm      AB  1   Living  2     SAB  1   IAB      Ectopic      Multiple      Live Births  2            Home Medications    Prior to Admission medications   Medication Sig Start Date End Date Taking? Authorizing Provider  doxycycline (VIBRAMYCIN) 100 MG capsule Take 1 capsule (100 mg total) by mouth 2 (two) times daily for 10 days. 09/18/20 09/28/20 Yes Briget Shaheed C, PA-C  triamcinolone cream (KENALOG) 0.1 % Apply 1 application topically 2 (two) times daily. 09/18/20   Yes Ronit Cranfield C, PA-C  meloxicam (MOBIC) 7.5 MG tablet Take 1 tablet (7.5 mg total) by mouth daily. 04/04/19   Belinda Fisher, PA-C    Family History Family History  Problem Relation Age of Onset  . Cancer Father   . Hypertension Mother   . Varicose Veins Mother     Social History Social History   Tobacco Use  . Smoking status: Current Every Day Smoker    Packs/day: 0.25    Types: Cigarettes    Last attempt to quit: 11/13/2012    Years since quitting: 7.8  . Smokeless tobacco: Never Used  Substance Use Topics  . Alcohol use: No  . Drug use: No     Allergies   Codeine, Penicillins, and Alpha-gal   Review of Systems Review of Systems  Constitutional: Negative for fatigue and fever.  HENT: Negative for mouth sores.   Eyes: Negative for visual disturbance.  Respiratory: Negative for shortness of breath.   Cardiovascular: Negative for chest pain.  Gastrointestinal: Negative for abdominal pain, nausea and vomiting.  Genitourinary: Negative for genital sores.  Musculoskeletal: Negative for arthralgias and joint swelling.  Skin: Positive for color change. Negative for rash and wound.  Neurological: Negative  for dizziness, weakness, light-headedness and headaches.     Physical Exam Triage Vital Signs ED Triage Vitals  Enc Vitals Group     BP 09/18/20 0947 (!) 147/80     Pulse Rate 09/18/20 0947 91     Resp 09/18/20 0947 18     Temp 09/18/20 0947 98.2 F (36.8 C)     Temp Source 09/18/20 0947 Oral     SpO2 09/18/20 0947 99 %     Weight --      Height --      Head Circumference --      Peak Flow --      Pain Score 09/18/20 0948 0     Pain Loc --      Pain Edu? --      Excl. in GC? --    No data found.  Updated Vital Signs BP (!) 147/80 (BP Location: Left Arm)   Pulse 91   Temp 98.2 F (36.8 C) (Oral)   Resp 18   SpO2 99%   Visual Acuity Right Eye Distance:   Left Eye Distance:   Bilateral Distance:    Right Eye Near:   Left Eye Near:     Bilateral Near:     Physical Exam Vitals and nursing note reviewed.  Constitutional:      Appearance: She is well-developed.     Comments: No acute distress  HENT:     Head: Normocephalic and atraumatic.     Nose: Nose normal.  Eyes:     Conjunctiva/sclera: Conjunctivae normal.  Cardiovascular:     Rate and Rhythm: Normal rate.  Pulmonary:     Effort: Pulmonary effort is normal. No respiratory distress.  Abdominal:     General: There is no distension.  Musculoskeletal:        General: Normal range of motion.     Cervical back: Neck supple.  Skin:    General: Skin is warm and dry.     Comments: Abdomen with multiple areas of bites with very small area of surrounding erythema, popliteal area of right knee with scabbed bite, surrounding bruising/ecchymosis discoloration in a bull's-eye pattern, no other rash noted to legs or extremities  Neurological:     Mental Status: She is alert and oriented to person, place, and time.      UC Treatments / Results  Labs (all labs ordered are listed, but only abnormal results are displayed) Labs Reviewed - No data to display  EKG   Radiology No results found.  Procedures Procedures (including critical care time)  Medications Ordered in UC Medications - No data to display  Initial Impression / Assessment and Plan / UC Course  I have reviewed the triage vital signs and the nursing notes.  Pertinent labs & imaging results that were available during my care of the patient were reviewed by me and considered in my medical decision making (see chart for details).     Tick bite-providing triamcinolone to help with any localized inflammatory reaction, placing on doxycycline x10 days.  Monitor for gradual resolution of bites and return to baseline.  Discussed strict return precautions. Patient verbalized understanding and is agreeable with plan.  Final Clinical Impressions(s) / UC Diagnoses   Final diagnoses:  Tick bite of right  knee, initial encounter     Discharge Instructions     Begin doxycycline twice daily for 10 days Warm compresses to area knee May use triamcinolone twice daily on areas of tick bites further help with itching  Please read attached on tickborne illnesses and follow-up if developing any symptoms over the next 1-2 months.    ED Prescriptions    Medication Sig Dispense Auth. Provider   doxycycline (VIBRAMYCIN) 100 MG capsule Take 1 capsule (100 mg total) by mouth 2 (two) times daily for 10 days. 20 capsule Dalina Samara C, PA-C   triamcinolone cream (KENALOG) 0.1 % Apply 1 application topically 2 (two) times daily. 30 g Romanita Fager, Sheldon C, PA-C     PDMP not reviewed this encounter.   Sharyon Cable Barronett C, PA-C 09/18/20 1009

## 2020-09-18 NOTE — ED Triage Notes (Signed)
Pt here for tick bite to right leg with some bruising noted around area; pt sts bite was earlier in week

## 2020-10-04 DIAGNOSIS — J069 Acute upper respiratory infection, unspecified: Secondary | ICD-10-CM | POA: Diagnosis not present

## 2020-10-04 DIAGNOSIS — Z1159 Encounter for screening for other viral diseases: Secondary | ICD-10-CM | POA: Diagnosis not present

## 2020-10-11 DIAGNOSIS — G8929 Other chronic pain: Secondary | ICD-10-CM | POA: Diagnosis not present

## 2020-10-11 DIAGNOSIS — M2550 Pain in unspecified joint: Secondary | ICD-10-CM | POA: Diagnosis not present

## 2020-10-11 DIAGNOSIS — W57XXXD Bitten or stung by nonvenomous insect and other nonvenomous arthropods, subsequent encounter: Secondary | ICD-10-CM | POA: Diagnosis not present

## 2020-10-11 DIAGNOSIS — R519 Headache, unspecified: Secondary | ICD-10-CM | POA: Diagnosis not present

## 2020-10-11 DIAGNOSIS — S80261D Insect bite (nonvenomous), right knee, subsequent encounter: Secondary | ICD-10-CM | POA: Diagnosis not present

## 2020-10-15 DIAGNOSIS — U071 COVID-19: Secondary | ICD-10-CM | POA: Diagnosis not present

## 2020-10-19 DIAGNOSIS — Z419 Encounter for procedure for purposes other than remedying health state, unspecified: Secondary | ICD-10-CM | POA: Diagnosis not present

## 2020-11-18 DIAGNOSIS — Z419 Encounter for procedure for purposes other than remedying health state, unspecified: Secondary | ICD-10-CM | POA: Diagnosis not present

## 2020-12-19 DIAGNOSIS — Z419 Encounter for procedure for purposes other than remedying health state, unspecified: Secondary | ICD-10-CM | POA: Diagnosis not present

## 2021-01-19 DIAGNOSIS — Z419 Encounter for procedure for purposes other than remedying health state, unspecified: Secondary | ICD-10-CM | POA: Diagnosis not present

## 2021-02-18 DIAGNOSIS — Z419 Encounter for procedure for purposes other than remedying health state, unspecified: Secondary | ICD-10-CM | POA: Diagnosis not present

## 2021-03-21 DIAGNOSIS — Z419 Encounter for procedure for purposes other than remedying health state, unspecified: Secondary | ICD-10-CM | POA: Diagnosis not present

## 2021-04-20 DIAGNOSIS — Z419 Encounter for procedure for purposes other than remedying health state, unspecified: Secondary | ICD-10-CM | POA: Diagnosis not present

## 2021-05-21 DIAGNOSIS — Z419 Encounter for procedure for purposes other than remedying health state, unspecified: Secondary | ICD-10-CM | POA: Diagnosis not present

## 2021-06-01 ENCOUNTER — Emergency Department (HOSPITAL_BASED_OUTPATIENT_CLINIC_OR_DEPARTMENT_OTHER)
Admission: EM | Admit: 2021-06-01 | Discharge: 2021-06-01 | Disposition: A | Discharge status: Home / Self Care | Payer: Medicaid Other | Attending: Emergency Medicine | Admitting: Emergency Medicine

## 2021-06-01 ENCOUNTER — Ambulatory Visit
Admission: EM | Admit: 2021-06-01 | Discharge: 2021-06-01 | Disposition: A | Discharge status: Home / Self Care | Payer: Medicaid Other | Attending: Internal Medicine | Admitting: Internal Medicine

## 2021-06-01 ENCOUNTER — Emergency Department (HOSPITAL_BASED_OUTPATIENT_CLINIC_OR_DEPARTMENT_OTHER): Payer: Medicaid Other

## 2021-06-01 ENCOUNTER — Encounter (HOSPITAL_BASED_OUTPATIENT_CLINIC_OR_DEPARTMENT_OTHER): Payer: Self-pay | Admitting: *Deleted

## 2021-06-01 ENCOUNTER — Encounter: Payer: Self-pay | Admitting: Emergency Medicine

## 2021-06-01 ENCOUNTER — Other Ambulatory Visit: Payer: Self-pay

## 2021-06-01 DIAGNOSIS — M940 Chondrocostal junction syndrome [Tietze]: Secondary | ICD-10-CM | POA: Diagnosis not present

## 2021-06-01 DIAGNOSIS — R0789 Other chest pain: Secondary | ICD-10-CM | POA: Diagnosis not present

## 2021-06-01 DIAGNOSIS — M79602 Pain in left arm: Secondary | ICD-10-CM | POA: Insufficient documentation

## 2021-06-01 DIAGNOSIS — R079 Chest pain, unspecified: Secondary | ICD-10-CM | POA: Diagnosis not present

## 2021-06-01 LAB — BASIC METABOLIC PANEL
Anion gap: 5 (ref 5–15)
BUN: 14 mg/dL (ref 6–20)
CO2: 23 mmol/L (ref 22–32)
Calcium: 9.2 mg/dL (ref 8.9–10.3)
Chloride: 110 mmol/L (ref 98–111)
Creatinine, Ser: 0.86 mg/dL (ref 0.44–1.00)
GFR, Estimated: >60 mL/min (ref >60)
Glucose, Bld: 95 mg/dL (ref 70–99)
Potassium: 4.1 mmol/L (ref 3.5–5.1)
Sodium: 138 mmol/L (ref 135–145)

## 2021-06-01 LAB — CBC WITH DIFFERENTIAL/PLATELET
Abs Immature Granulocytes: 0.02 10*3/uL (ref 0.00–0.07)
Basophils Absolute: 0 10*3/uL (ref 0.0–0.1)
Basophils Relative: 0 %
Eosinophils Absolute: 0.1 10*3/uL (ref 0.0–0.5)
Eosinophils Relative: 1 %
HCT: 39.8 % (ref 36.0–46.0)
Hemoglobin: 13.2 g/dL (ref 12.0–15.0)
Immature Granulocytes: 0 %
Lymphocytes Relative: 32 %
Lymphs Abs: 2.5 10*3/uL (ref 0.7–4.0)
MCH: 31.1 pg (ref 26.0–34.0)
MCHC: 33.2 g/dL (ref 30.0–36.0)
MCV: 93.6 fL (ref 80.0–100.0)
Monocytes Absolute: 0.5 10*3/uL (ref 0.1–1.0)
Monocytes Relative: 7 %
Neutro Abs: 4.6 10*3/uL (ref 1.7–7.7)
Neutrophils Relative %: 60 %
Platelets: 176 10*3/uL (ref 150–400)
RBC: 4.25 MIL/uL (ref 3.87–5.11)
RDW: 13 % (ref 11.5–15.5)
WBC: 7.8 10*3/uL (ref 4.0–10.5)
nRBC: 0 % (ref 0.0–0.2)

## 2021-06-01 LAB — TROPONIN I (HIGH SENSITIVITY): Troponin I (High Sensitivity): 2 ng/L (ref <18)

## 2021-06-01 NOTE — Discharge Instructions (Signed)
°  Please report to:  [facility]  [address] [place], [place] [address]   

## 2021-06-01 NOTE — ED Provider Notes (Signed)
[facility] Provider Note   CSN: [id number] Arrival date & time: 06/01/21  1543     History  Chief Complaint  Patient presents with   chest wall pain     Sandra Meyer is a 41 y.o. female.  HPI  This is a 41 year old female with history of alpha gal and anemia presenting due to chest pain.  The chest pain started acutely 3 weeks ago, its been constant since then.  Initially it started after having a seizure.  Patient is not on any antiepileptics, states that when exposed to meat due to her alpha gal she will have seizures that last between 35 minutes to an hour.  States after 1 of these episodes in December around Christmas she started having pain in her left arm and left chest wall.  The pain has been constant, is worse with movement.  It worsened a week and half ago, unable to identify why.  Feels like a sharp stabbing pain, not associated with nausea or vomiting.  She does feel short of breath when the pain intensifies.  Unable to identify provoking features, she tried ibuprofen this morning which did not alleviate the pain.  She was sent here from urgent care, patient does not have a cardiac history.  Social history: Tobacco use, daily  Past Medical History:  Diagnosis Date   Allergy to alpha-gal    AMA (advanced maternal age) multigravida 35+    Anemia     Home Medications Prior to Admission medications   Medication Sig Start Date End Date Taking? Authorizing Provider  meloxicam (MOBIC) 7.5 MG tablet Take 1 tablet (7.5 mg total) by mouth daily. 04/04/19   Tasia Catchings, Amy V, PA-C  triamcinolone cream (KENALOG) 0.1 % Apply 1 application topically 2 (two) times daily. 09/18/20   Wieters, Hallie C, PA-C      Allergies    Codeine, Penicillins, and Alpha-gal    Review of Systems   Review of Systems  Constitutional:  Negative for fever.  Respiratory:  Positive for shortness of breath.   Cardiovascular:  Positive for chest pain.  Gastrointestinal:   Negative for nausea and vomiting.  Musculoskeletal:  Positive for myalgias. Negative for back pain and neck pain.  Skin:  Negative for wound.  Allergic/Immunologic: Negative for immunocompromised state.  Neurological:  Positive for seizures. Negative for dizziness and syncope.   Physical Exam Updated Vital Signs BP 95/65    Pulse 65    Temp 98.4 F (36.9 C)    Resp 12    Ht 5' 11.5" (1.816 m)    Wt 74.8 kg    LMP 05/03/2021 Comment: tubes tied   SpO2 100%    BMI 22.69 kg/m  Physical Exam Vitals and nursing note reviewed. Exam conducted with a chaperone present.  Constitutional:      Appearance: Normal appearance.  HENT:     Head: Normocephalic and atraumatic.  Eyes:     General: No scleral icterus.       Right eye: No discharge.        Left eye: No discharge.     Extraocular Movements: Extraocular movements intact.     Pupils: Pupils are equal, round, and reactive to light.  Cardiovascular:     Rate and Rhythm: Normal rate and regular rhythm.     Pulses: Normal pulses.     Heart sounds: Normal heart sounds. No murmur heard.   No friction rub. No gallop.     Comments: S1-S2, radial pulse 2+  equal bilaterally Pulmonary:     Effort: Pulmonary effort is normal. No respiratory distress.     Breath sounds: Normal breath sounds.  Abdominal:     General: Abdomen is flat. Bowel sounds are normal. There is no distension.     Palpations: Abdomen is soft.     Tenderness: There is no abdominal tenderness.  Skin:    General: Skin is warm and dry.     Coloration: Skin is not jaundiced.  Neurological:     Mental Status: She is alert. Mental status is at baseline.     Coordination: Coordination normal.   ED Results / Procedures / Treatments   Labs (all labs ordered are listed, but only abnormal results are displayed) Labs Reviewed  BASIC METABOLIC PANEL  CBC WITH DIFFERENTIAL/PLATELET  TROPONIN I (HIGH SENSITIVITY)  TROPONIN I (HIGH SENSITIVITY)    EKG None  Radiology DG Chest  Portable 1 View  Result Date: 06/01/2021 CLINICAL DATA:  Chest pain. EXAM: PORTABLE CHEST 1 VIEW COMPARISON:  None. FINDINGS: The heart size and mediastinal contours are within normal limits. Both lungs are clear. The visualized skeletal structures are unremarkable. IMPRESSION: No active disease. Electronically Signed   By: Marijo Conception M.D.   On: 06/01/2021 16:22    Procedures Procedures    Medications Ordered in ED Medications - No data to display  ED Course/ Medical Decision Making/ A&P Clinical Course as of 06/01/21 1949  Thu Jun 01, 2021  1645 Basophils Absolute: 0.0 [HS]    Clinical Course User Index [HS] Sherrill Raring, PA-C                           Medical Decision Making  This is a 41 year old female with history of anemia and alpha gal presenting due to chest pain.  Her vitals are stable, her lungs are clear to auscultation.  S1-S2 clearly differentiated with symmetric radial pulse.  Chest wall pain is reproducible with palpation suggestive of musculoskeletal etiology.  We will still check a chest x-ray as well as troponin and basic labs.  Additional history obtained: -Additional history obtained from chart review -External records from outside source obtained and reviewed including: Chart review including previous notes, labs, imaging, consultation notes   Lab Tests: -I ordered, reviewed, and interpreted labs.  The pertinent results include:   Negative initial troponin.  Given the past chest pain has been constant for 3 weeks do not feel we need to trend at this time.  Additionally, her heart score is very low. CBC is without any leukocytosis or anemia. BMP: No electrolyte derangement, no AKI.    EKG -Normal sinus rhythm, no signs of ischemia or arrhythmia   Imaging Studies ordered: -I ordered imaging studies including chest x-ray -I independently visualized and interpreted imaging which showed normal cardiac silhouette without any evidence of pneumonia,  cardiomegaly or rib fracture or pneumothorax -I agree with the radiologist interpretation     Cardiac Monitoring: The patient was maintained on a cardiac monitor.  I personally viewed and interpreted the cardiac monitored which showed an underlying rhythm of: Normal sinus rhythm   Reevaluation: After the interventions noted above, I reevaluated the patient and found that they have :stayed the same  I suspect the patient symptoms are secondary to costochondritis.  I considered PE but given she is PERC negative, not tachycardic or hypoxic I think that is less likely.  There is no evidence of pneumonia on exam, no leukocytosis suggestive  of underlying factious etiology.  EKG does not show any ischemia, troponin negative and her age makes ACS less likely.  Patient was offered pain medicine in the ED but she declined due to concerns about exposure to meat products.  At this time I do not suspect any emergent etiology for her chest pain.  I suspect is secondary to costochondritis, pericarditis was considered as well but given the lack of PR depression, global ST elevation or positional component. I think this is less likely.  Return precaution discussed, patient was discharged in stable condition.  Dispostion: Follow-up with your primary care doctor, take analgesics at home.        Final Clinical Impression(s) / ED Diagnoses Final diagnoses:  Costochondritis    Rx / DC Orders ED Discharge Orders     None         Sherrill Raring, [place] 06/01/21 1949    Gareth Morgan, MD 06/02/21 1128

## 2021-06-01 NOTE — ED Notes (Signed)
Pt reports she has not been on her meds for 5 days. States she is afraid she might do something to hurt herself, but no plan. Having auditory hallucinations this morning stating her house was talking to her and making fun of her

## 2021-06-01 NOTE — ED Triage Notes (Signed)
Hx alpha gal. At christmas she was exposed to meat and had an extended seizure. Ever since then her left arm has been hurting. Says the pain began going into her chest and growing over the last week.

## 2021-06-01 NOTE — Discharge Instructions (Addendum)
Follow-up with your primary care doctor about the symptoms if they continue to persist.  Your work-up today was reassuring, there is no evidence of pneumonia, heart attacks, emergent sources of your heart pain.  I suspect you have costochondritis which is inflammation and pain to the chest wall.  I found a resource guide on medicine that you should be able to take.  It appears that as far as pain you can take the following: Tylenol extra strength CAPLETS (no liquid or gels). Laural Benes and [employer] brand is fully plant based. Advil film coated tablets (no liquid or gels). [employer] brand is plant based.    [email or web address]

## 2021-06-01 NOTE — ED Triage Notes (Signed)
Sent here from UC for eval , left chest wall pain and left shoulder pain after seizure on christmas, increased pain with movt.

## 2021-06-01 NOTE — ED Provider Notes (Signed)
[facility]    CSN: [id number] Arrival date & time: 06/01/21  1432      History   Chief Complaint Chief Complaint  Patient presents with   Chest Pain    HPI Sandra Meyer is a 41 y.o. female.   Patient here today for evaluation of chest pain that is left sided. She reports pain initially started with left arm pain after a seizure she had on Christmas. Since that time pain has gradually spread to chest over the last week and has been worsening with time. She does endorse some shortness of breath. She has some improvement with rest but specifically keeping her arm in one stable position.   The history is provided by the patient.  Chest Pain Associated symptoms: no fever and no shortness of breath    Past Medical History:  Diagnosis Date   Allergy to alpha-gal    AMA (advanced maternal age) multigravida 35+    Anemia     Patient Active Problem List   Diagnosis Date Noted   Possible exposure to STD 03/01/2017   Dyspareunia in female 03/01/2017   Abnormal uterine bleeding (AUB) 03/01/2017    Past Surgical History:  Procedure Laterality Date   CESAREAN SECTION     TONSILLECTOMY     TUBAL LIGATION Bilateral 02/21/2014   Procedure: POST PARTUM TUBAL LIGATION;  Surgeon: Catalina Antigua, MD;  Location: [facility];  Service: Gynecology;  Laterality: Bilateral;    OB History     Gravida  3   Para  2   Term  2   Preterm      AB  1   Living  2      SAB  1   IAB      Ectopic      Multiple      Live Births  2            Home Medications    Prior to Admission medications   Medication Sig Start Date End Date Taking? Authorizing Provider  meloxicam (MOBIC) 7.5 MG tablet Take 1 tablet (7.5 mg total) by mouth daily. 04/04/19   Cathie Hoops, Amy V, PA-C  triamcinolone cream (KENALOG) 0.1 % Apply 1 application topically 2 (two) times daily. 09/18/20   Wieters, Junius Creamer, PA-C    Family History Family History  Problem Relation Age of Onset   Cancer Father     Hypertension Mother    Varicose Veins Mother     Social History Social History   Tobacco Use   Smoking status: Every Day    Packs/day: 0.25    Types: Cigarettes    Last attempt to quit: 11/13/2012    Years since quitting: 8.5   Smokeless tobacco: Never  Substance Use Topics   Alcohol use: No   Drug use: No     Allergies   Codeine, Penicillins, and Alpha-gal   Review of Systems Review of Systems  Constitutional:  Negative for chills and fever.  Eyes:  Negative for discharge and redness.  Respiratory:  Negative for chest tightness and shortness of breath.   Cardiovascular:  Positive for chest pain.  Musculoskeletal:  Positive for myalgias.    Physical Exam Triage Vital Signs ED Triage Vitals  Enc Vitals Group     BP 06/01/21 1443 120/83     Pulse Rate 06/01/21 1443 71     Resp 06/01/21 1443 16     Temp 06/01/21 1443 98.2 F (36.8 C)     Temp Source 06/01/21  1443 Oral     SpO2 06/01/21 1443 96 %     Weight --      Height --      Head Circumference --      Peak Flow --      Pain Score 06/01/21 1444 10     Pain Loc --      Pain Edu? --      Excl. in GC? --    No data found.  Updated Vital Signs BP 120/83 (BP Location: Left Arm)    Pulse 71    Temp 98.2 F (36.8 C) (Oral)    Resp 16    SpO2 96%      Physical Exam Vitals and nursing note reviewed.  Constitutional:      General: She is not in acute distress.    Appearance: Normal appearance. She is not ill-appearing.  HENT:     Head: Normocephalic and atraumatic.     Nose: No congestion or rhinorrhea.  Eyes:     Conjunctiva/sclera: Conjunctivae normal.  Cardiovascular:     Rate and Rhythm: Normal rate and regular rhythm.     Heart sounds: Normal heart sounds. No murmur heard. Pulmonary:     Effort: Pulmonary effort is normal. No respiratory distress.     Breath sounds: Normal breath sounds. No wheezing, rhonchi or rales.  Chest:     Chest wall: Tenderness present.  Skin:    General: Skin is  warm and dry.  Neurological:     Mental Status: She is alert.  Psychiatric:        Mood and Affect: Mood normal.        Thought Content: Thought content normal.     UC Treatments / Results  Labs (all labs ordered are listed, but only abnormal results are displayed) Labs Reviewed - No data to display  EKG   Radiology No results found.  Procedures Procedures (including critical care time)  Medications Ordered in UC Medications - No data to display  Initial Impression / Assessment and Plan / UC Course  I have reviewed the triage vital signs and the nursing notes.  Pertinent labs & imaging results that were available during my care of the patient were reviewed by me and considered in my medical decision making (see chart for details).   Suspect most likely musculoskeletal etiology of pain given replicable with palpation. Recommended further evaluation in the ED for troponin to definitively rule out cardiac etiology given shortness of breath, etc. Patient is agreeable to same and stable to drive herself to local ED.   Final Clinical Impressions(s) / UC Diagnoses   Final diagnoses:  Chest discomfort     Discharge Instructions       Please report to   [facility] [place]  [address] [place], [place] [address]       ED Prescriptions   None    PDMP not reviewed this encounter.   Tomi Bamberger, PA-C 06/01/21 1526

## 2021-06-06 DIAGNOSIS — M25512 Pain in left shoulder: Secondary | ICD-10-CM | POA: Diagnosis not present

## 2021-06-06 DIAGNOSIS — Z91018 Allergy to other foods: Secondary | ICD-10-CM | POA: Diagnosis not present

## 2021-06-21 DIAGNOSIS — Z419 Encounter for procedure for purposes other than remedying health state, unspecified: Secondary | ICD-10-CM | POA: Diagnosis not present

## 2021-07-19 DIAGNOSIS — Z419 Encounter for procedure for purposes other than remedying health state, unspecified: Secondary | ICD-10-CM | POA: Diagnosis not present

## 2021-08-19 DIAGNOSIS — Z419 Encounter for procedure for purposes other than remedying health state, unspecified: Secondary | ICD-10-CM | POA: Diagnosis not present

## 2021-09-18 DIAGNOSIS — Z419 Encounter for procedure for purposes other than remedying health state, unspecified: Secondary | ICD-10-CM | POA: Diagnosis not present

## 2021-10-19 DIAGNOSIS — Z419 Encounter for procedure for purposes other than remedying health state, unspecified: Secondary | ICD-10-CM | POA: Diagnosis not present

## 2021-11-18 DIAGNOSIS — Z419 Encounter for procedure for purposes other than remedying health state, unspecified: Secondary | ICD-10-CM | POA: Diagnosis not present

## 2021-11-28 DIAGNOSIS — R55 Syncope and collapse: Secondary | ICD-10-CM | POA: Diagnosis not present

## 2021-11-28 DIAGNOSIS — Z91018 Allergy to other foods: Secondary | ICD-10-CM | POA: Diagnosis not present

## 2021-11-28 DIAGNOSIS — R569 Unspecified convulsions: Secondary | ICD-10-CM | POA: Diagnosis not present

## 2021-11-28 DIAGNOSIS — R002 Palpitations: Secondary | ICD-10-CM | POA: Diagnosis not present

## 2021-11-30 DIAGNOSIS — R002 Palpitations: Secondary | ICD-10-CM | POA: Diagnosis not present

## 2021-12-19 DIAGNOSIS — Z419 Encounter for procedure for purposes other than remedying health state, unspecified: Secondary | ICD-10-CM | POA: Diagnosis not present

## 2021-12-28 DIAGNOSIS — R002 Palpitations: Secondary | ICD-10-CM | POA: Diagnosis not present

## 2021-12-28 DIAGNOSIS — R55 Syncope and collapse: Secondary | ICD-10-CM | POA: Diagnosis not present

## 2021-12-28 DIAGNOSIS — Z91018 Allergy to other foods: Secondary | ICD-10-CM | POA: Diagnosis not present

## 2022-01-19 DIAGNOSIS — Z419 Encounter for procedure for purposes other than remedying health state, unspecified: Secondary | ICD-10-CM | POA: Diagnosis not present

## 2022-02-15 DIAGNOSIS — F4310 Post-traumatic stress disorder, unspecified: Secondary | ICD-10-CM | POA: Diagnosis not present

## 2022-02-15 DIAGNOSIS — F419 Anxiety disorder, unspecified: Secondary | ICD-10-CM | POA: Diagnosis not present

## 2022-02-15 DIAGNOSIS — F422 Mixed obsessional thoughts and acts: Secondary | ICD-10-CM | POA: Diagnosis not present

## 2022-02-15 DIAGNOSIS — F32A Depression, unspecified: Secondary | ICD-10-CM | POA: Diagnosis not present

## 2022-02-18 DIAGNOSIS — Z419 Encounter for procedure for purposes other than remedying health state, unspecified: Secondary | ICD-10-CM | POA: Diagnosis not present

## 2022-03-15 DIAGNOSIS — F429 Obsessive-compulsive disorder, unspecified: Secondary | ICD-10-CM | POA: Diagnosis not present

## 2022-03-15 DIAGNOSIS — F4310 Post-traumatic stress disorder, unspecified: Secondary | ICD-10-CM | POA: Diagnosis not present

## 2022-03-21 DIAGNOSIS — Z419 Encounter for procedure for purposes other than remedying health state, unspecified: Secondary | ICD-10-CM | POA: Diagnosis not present

## 2022-03-29 DIAGNOSIS — F4310 Post-traumatic stress disorder, unspecified: Secondary | ICD-10-CM | POA: Diagnosis not present

## 2022-03-29 DIAGNOSIS — F429 Obsessive-compulsive disorder, unspecified: Secondary | ICD-10-CM | POA: Diagnosis not present

## 2022-04-01 IMAGING — DX DG CHEST 1V PORT
1 series · 1 of 1 positions shown · non-contrast
Comparison: None.

CLINICAL DATA: Chest pain.

EXAM:
PORTABLE CHEST 1 VIEW

[chest ap]
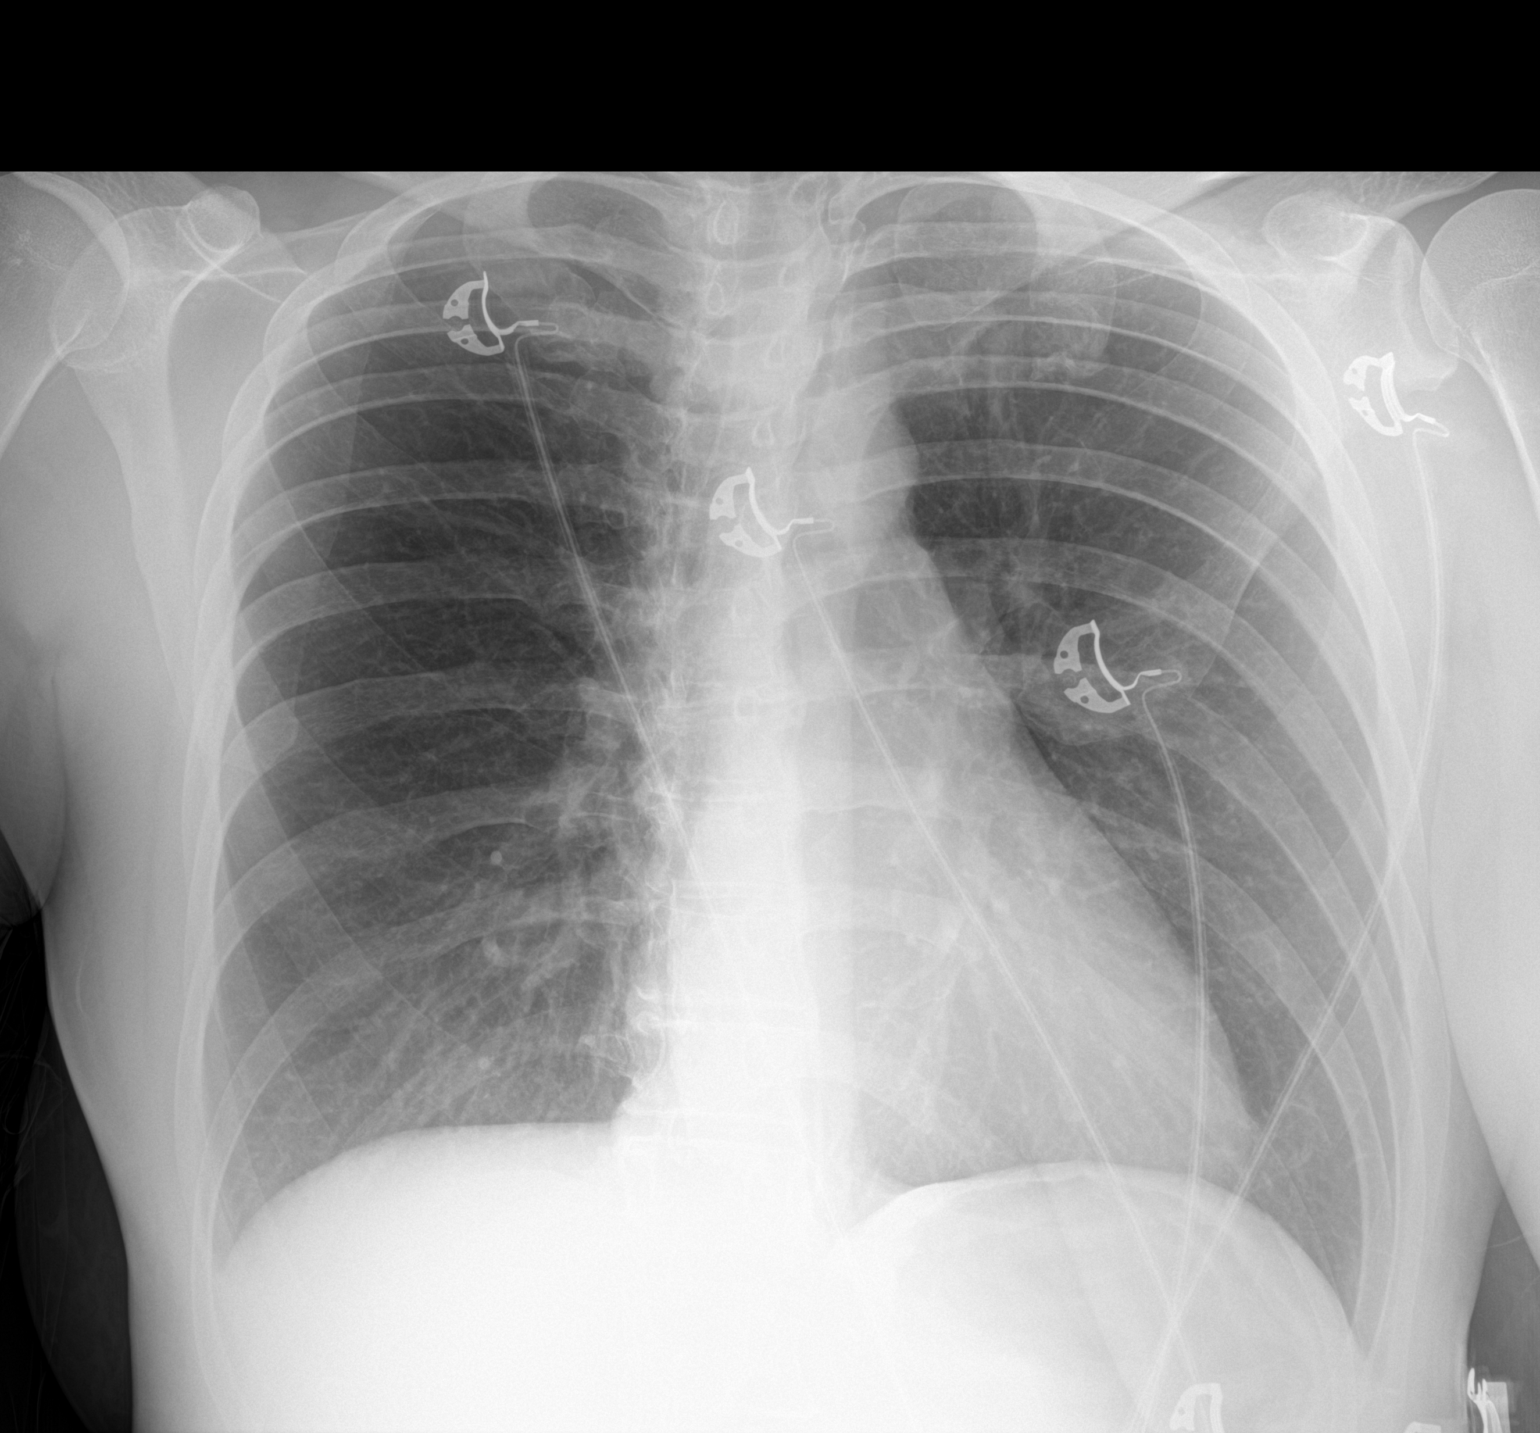

[1 of 1 positions shown; findings below may reference images not displayed]

FINDINGS: The heart size and mediastinal contours are within normal limits.
Both lungs are clear. The visualized skeletal structures are
unremarkable.
IMPRESSION: No active disease.

## 2022-04-20 DIAGNOSIS — Z419 Encounter for procedure for purposes other than remedying health state, unspecified: Secondary | ICD-10-CM | POA: Diagnosis not present

## 2022-04-25 DIAGNOSIS — F4310 Post-traumatic stress disorder, unspecified: Secondary | ICD-10-CM | POA: Diagnosis not present

## 2022-04-25 DIAGNOSIS — F429 Obsessive-compulsive disorder, unspecified: Secondary | ICD-10-CM | POA: Diagnosis not present

## 2022-05-21 DIAGNOSIS — Z419 Encounter for procedure for purposes other than remedying health state, unspecified: Secondary | ICD-10-CM | POA: Diagnosis not present

## 2022-06-19 ENCOUNTER — Emergency Department (HOSPITAL_COMMUNITY): Payer: Medicaid Other

## 2022-06-19 ENCOUNTER — Encounter (HOSPITAL_COMMUNITY): Payer: Self-pay | Admitting: Emergency Medicine

## 2022-06-19 ENCOUNTER — Other Ambulatory Visit: Payer: Self-pay

## 2022-06-19 ENCOUNTER — Inpatient Hospital Stay (HOSPITAL_COMMUNITY)
Admission: EM | Admit: 2022-06-19 | Discharge: 2022-06-21 | DRG: 916 | Disposition: A | Discharge status: Home / Self Care | Payer: Medicaid Other | Attending: Internal Medicine | Admitting: Internal Medicine

## 2022-06-19 DIAGNOSIS — Z809 Family history of malignant neoplasm, unspecified: Secondary | ICD-10-CM

## 2022-06-19 DIAGNOSIS — T781XXA Other adverse food reactions, not elsewhere classified, initial encounter: Secondary | ICD-10-CM

## 2022-06-19 DIAGNOSIS — T782XXA Anaphylactic shock, unspecified, initial encounter: Secondary | ICD-10-CM | POA: Diagnosis not present

## 2022-06-19 DIAGNOSIS — Z880 Allergy status to penicillin: Secondary | ICD-10-CM

## 2022-06-19 DIAGNOSIS — R569 Unspecified convulsions: Secondary | ICD-10-CM | POA: Diagnosis present

## 2022-06-19 DIAGNOSIS — Z743 Need for continuous supervision: Secondary | ICD-10-CM | POA: Diagnosis not present

## 2022-06-19 DIAGNOSIS — R918 Other nonspecific abnormal finding of lung field: Secondary | ICD-10-CM | POA: Diagnosis not present

## 2022-06-19 DIAGNOSIS — T7809XA Anaphylactic reaction due to other food products, initial encounter: Principal | ICD-10-CM | POA: Diagnosis present

## 2022-06-19 DIAGNOSIS — Z885 Allergy status to narcotic agent status: Secondary | ICD-10-CM | POA: Diagnosis not present

## 2022-06-19 DIAGNOSIS — I959 Hypotension, unspecified: Secondary | ICD-10-CM | POA: Diagnosis not present

## 2022-06-19 DIAGNOSIS — Z419 Encounter for procedure for purposes other than remedying health state, unspecified: Secondary | ICD-10-CM | POA: Diagnosis not present

## 2022-06-19 DIAGNOSIS — R4182 Altered mental status, unspecified: Secondary | ICD-10-CM | POA: Diagnosis not present

## 2022-06-19 DIAGNOSIS — R9431 Abnormal electrocardiogram [ECG] [EKG]: Secondary | ICD-10-CM | POA: Diagnosis not present

## 2022-06-19 DIAGNOSIS — F17210 Nicotine dependence, cigarettes, uncomplicated: Secondary | ICD-10-CM | POA: Diagnosis present

## 2022-06-19 DIAGNOSIS — Z791 Long term (current) use of non-steroidal anti-inflammatories (NSAID): Secondary | ICD-10-CM | POA: Diagnosis not present

## 2022-06-19 DIAGNOSIS — Z91014 Allergy to mammalian meats: Secondary | ICD-10-CM

## 2022-06-19 DIAGNOSIS — R55 Syncope and collapse: Secondary | ICD-10-CM | POA: Diagnosis not present

## 2022-06-19 DIAGNOSIS — F4310 Post-traumatic stress disorder, unspecified: Secondary | ICD-10-CM | POA: Diagnosis not present

## 2022-06-19 DIAGNOSIS — R000 Tachycardia, unspecified: Secondary | ICD-10-CM | POA: Diagnosis not present

## 2022-06-19 DIAGNOSIS — Z8249 Family history of ischemic heart disease and other diseases of the circulatory system: Secondary | ICD-10-CM

## 2022-06-19 DIAGNOSIS — Z91018 Allergy to other foods: Secondary | ICD-10-CM

## 2022-06-19 DIAGNOSIS — T7840XA Allergy, unspecified, initial encounter: Secondary | ICD-10-CM | POA: Diagnosis not present

## 2022-06-19 LAB — COMPREHENSIVE METABOLIC PANEL
ALT: 12 U/L (ref 0–44)
AST: 21 U/L (ref 15–41)
Albumin: 3.6 g/dL (ref 3.5–5.0)
Alkaline Phosphatase: 41 U/L (ref 38–126)
Anion gap: 11 (ref 5–15)
BUN: 11 mg/dL (ref 6–20)
CO2: 20 mmol/L — ABNORMAL LOW (ref 22–32)
Calcium: 8.8 mg/dL — ABNORMAL LOW (ref 8.9–10.3)
Chloride: 108 mmol/L (ref 98–111)
Creatinine, Ser: 0.85 mg/dL (ref 0.44–1.00)
GFR, Estimated: >60 mL/min (ref >60)
Glucose, Bld: 115 mg/dL — ABNORMAL HIGH (ref 70–99)
Potassium: 3.7 mmol/L (ref 3.5–5.1)
Sodium: 139 mmol/L (ref 135–145)
Total Bilirubin: 0.7 mg/dL (ref 0.3–1.2)
Total Protein: 5.6 g/dL — ABNORMAL LOW (ref 6.5–8.1)

## 2022-06-19 LAB — CBC WITH DIFFERENTIAL/PLATELET
Abs Immature Granulocytes: 0.04 10*3/uL (ref 0.00–0.07)
Basophils Absolute: 0 10*3/uL (ref 0.0–0.1)
Basophils Relative: 0 %
Eosinophils Absolute: 0.1 10*3/uL (ref 0.0–0.5)
Eosinophils Relative: 1 %
HCT: 36.2 % (ref 36.0–46.0)
Hemoglobin: 12.2 g/dL (ref 12.0–15.0)
Immature Granulocytes: 0 %
Lymphocytes Relative: 29 %
Lymphs Abs: 3.7 10*3/uL (ref 0.7–4.0)
MCH: 31.4 pg (ref 26.0–34.0)
MCHC: 33.7 g/dL (ref 30.0–36.0)
MCV: 93.3 fL (ref 80.0–100.0)
Monocytes Absolute: 0.8 10*3/uL (ref 0.1–1.0)
Monocytes Relative: 6 %
Neutro Abs: 7.9 10*3/uL — ABNORMAL HIGH (ref 1.7–7.7)
Neutrophils Relative %: 64 %
Platelets: 206 10*3/uL (ref 150–400)
RBC: 3.88 MIL/uL (ref 3.87–5.11)
RDW: 12.9 % (ref 11.5–15.5)
WBC: 12.5 10*3/uL — ABNORMAL HIGH (ref 4.0–10.5)
nRBC: 0 % (ref 0.0–0.2)

## 2022-06-19 LAB — I-STAT BETA HCG BLOOD, ED (MC, WL, AP ONLY): I-stat hCG, quantitative: <5 m[IU]/mL (ref <5)

## 2022-06-19 MED ORDER — DIPHENHYDRAMINE HCL 50 MG/ML IJ SOLN
50.0000 mg | Freq: Once | INTRAMUSCULAR | Status: AC
  Administered 2022-06-19: 50 mg via INTRAVENOUS
  Filled 2022-06-19: qty 1

## 2022-06-19 MED ORDER — METHYLPREDNISOLONE SODIUM SUCC 125 MG IJ SOLR
125.0000 mg | Freq: Once | INTRAMUSCULAR | Status: AC
Start: 2022-06-19 — End: 2022-06-19
  Administered 2022-06-19: 125 mg via INTRAVENOUS
  Filled 2022-06-19: qty 2

## 2022-06-19 MED ORDER — EPINEPHRINE 0.3 MG/0.3ML IJ SOAJ
0.3000 mg | Freq: Once | INTRAMUSCULAR | Status: AC
  Administered 2022-06-19: 0.3 mg via INTRAMUSCULAR
  Filled 2022-06-19: qty 0.3

## 2022-06-19 MED ORDER — METHYLPREDNISOLONE SODIUM SUCC 125 MG IJ SOLR
60.0000 mg | INTRAMUSCULAR | Status: DC
  Administered 2022-06-20: 60 mg via INTRAVENOUS
  Filled 2022-06-19: qty 2

## 2022-06-19 MED ORDER — SODIUM CHLORIDE 0.9 % IV BOLUS
1000.0000 mL | Freq: Once | INTRAVENOUS | Status: AC
  Administered 2022-06-19: 1000 mL via INTRAVENOUS

## 2022-06-19 MED ORDER — SODIUM CHLORIDE 0.9 % IV SOLN
250.0000 mL | INTRAVENOUS | Status: DC
  Administered 2022-06-20: 250 mL via INTRAVENOUS

## 2022-06-19 MED ORDER — FAMOTIDINE IN NACL 20-0.9 MG/50ML-% IV SOLN
20.0000 mg | Freq: Two times a day (BID) | INTRAVENOUS | Status: DC
  Administered 2022-06-19: 20 mg via INTRAVENOUS
  Filled 2022-06-19: qty 50

## 2022-06-19 MED ORDER — EPINEPHRINE HCL 5 MG/250ML IV SOLN IN NS
0.5000 ug/min | INTRAVENOUS | Status: DC
  Administered 2022-06-19: 0.5 ug/min via INTRAVENOUS
  Filled 2022-06-19: qty 250

## 2022-06-19 MED ORDER — LORAZEPAM 2 MG/ML IJ SOLN
2.0000 mg | Freq: Once | INTRAMUSCULAR | Status: AC
  Administered 2022-06-19: 2 mg via INTRAVENOUS
  Filled 2022-06-19: qty 1

## 2022-06-19 NOTE — ED Provider Notes (Signed)
[facility] Provider Note   CSN: [id number] Arrival date & time: 06/19/22  2104     History  Chief Complaint  Patient presents with   Allergic Reaction    Sandra Meyer is a 42 y.o. female history of alpha gal allergy here presenting with possible anaphylaxis.  Patient has severe alpha gal allergy and cannot even smell any meat.  Patient ate some food with new seasoning that could have some meat in it around 6:30 PM.  About an hour afterwards, she had seizure-like activity and vomiting.  Patient was given 5 mg Versed IM and 2.5 mg of Versed IV prior to arrival.  Patient has no history of seizure activity.  Per the husband, she gets the symptoms when she has an alpha gal allergy.  The history is provided by the patient.       Home Medications Prior to Admission medications   Medication Sig Start Date End Date Taking? Authorizing Provider  meloxicam (MOBIC) 7.5 MG tablet Take 1 tablet (7.5 mg total) by mouth daily. 04/04/19   Tasia Catchings, Amy V, PA-C  triamcinolone cream (KENALOG) 0.1 % Apply 1 application topically 2 (two) times daily. 09/18/20   Wieters, Hallie C, PA-C      Allergies    Codeine, Penicillins, and Alpha-gal    Review of Systems   Review of Systems  Neurological:  Positive for seizures and weakness.  All other systems reviewed and are negative.   Physical Exam Updated Vital Signs BP (!) 81/54   Pulse 85   Temp 97.8 F (36.6 C) (Temporal)   Resp (!) 21   Ht 5\' 11"  (1.803 m)   Wt 74.8 kg   SpO2 98%   BMI 23.00 kg/m  Physical Exam Vitals and nursing note reviewed.  Constitutional:      Comments: Confused and have uncontrollable arm and leg movements but awake  HENT:     Head: Normocephalic.     Comments: No eye deviation    Nose: Nose normal.     Mouth/Throat:     Mouth: Mucous membranes are dry.  Eyes:     Comments: No obvious eye deviation  Cardiovascular:     Rate and Rhythm: Regular rhythm.  Pulmonary:      Effort: Pulmonary effort is normal.     Breath sounds: Normal breath sounds.  Abdominal:     General: Abdomen is flat.     Palpations: Abdomen is soft.  Musculoskeletal:        General: Normal range of motion.     Cervical back: Normal range of motion and neck supple.  Skin:    General: Skin is warm.     Capillary Refill: Capillary refill takes less than 2 seconds.     Comments: Urticaria on the right side of her back and chest  Neurological:     Comments: Patient appears slightly confused but able to answer simple questions.  Patient has uncontrollable arm and leg movements  Psychiatric:     Comments: Unable      ED Results / Procedures / Treatments   Labs (all labs ordered are listed, but only abnormal results are displayed) Labs Reviewed  CBC WITH DIFFERENTIAL/PLATELET - Abnormal; Notable for the following components:      Result Value   WBC 12.5 (*)    Neutro Abs 7.9 (*)    All other components within normal limits  COMPREHENSIVE METABOLIC PANEL - Abnormal; Notable for the following components:  CO2 20 (*)    Glucose, Bld 115 (*)    Calcium 8.8 (*)    Total Protein 5.6 (*)    All other components within normal limits  I-STAT BETA HCG BLOOD, ED ([facility], [facility], [facility] ONLY)    EKG EKG Interpretation  Date/Time:  Tuesday June 19 2022 21:23:46 EST Ventricular Rate:  96 PR Interval:  135 QRS Duration: 94 QT Interval:  374 QTC Calculation: 473 R Axis:   88 Text Interpretation: Sinus rhythm No significant change since last tracing Confirmed by Wandra Arthurs [phone]) on 06/19/2022 9:51:55 PM  Radiology No results found.  Procedures Procedures     CRITICAL CARE Performed by: Wandra Arthurs   Total critical care time: 41 minutes  Critical care time was exclusive of separately billable procedures and treating other patients.  Critical care was necessary to treat or prevent imminent or life-threatening deterioration.  Critical care was time spent personally by me on  the following activities: development of treatment plan with patient and/or surrogate as well as nursing, discussions with consultants, evaluation of patient's response to treatment, examination of patient, obtaining history from patient or surrogate, ordering and performing treatments and interventions, ordering and review of laboratory studies, ordering and review of radiographic studies, pulse oximetry and re-evaluation of patient's condition.  Medications Ordered in ED Medications  sodium chloride 0.9 % bolus 1,000 mL (0 mLs Intravenous Stopped 06/19/22 2201)  LORazepam (ATIVAN) injection 2 mg (2 mg Intravenous Given 06/19/22 2113)  methylPREDNISolone sodium succinate (SOLU-MEDROL) 125 mg/2 mL injection 125 mg (125 mg Intravenous Given 06/19/22 2115)  diphenhydrAMINE (BENADRYL) injection 50 mg (50 mg Intravenous Given 06/19/22 2116)  EPINEPHrine (EPI-PEN) injection 0.3 mg (0.3 mg Intramuscular Given 06/19/22 2114)  sodium chloride 0.9 % bolus 1,000 mL (1,000 mLs Intravenous New Bag/Given 06/19/22 2141)  sodium chloride 0.9 % bolus 1,000 mL (1,000 mLs Intravenous New Bag/Given 06/19/22 2201)    ED Course/ Medical Decision Making/ A&P                             Medical Decision Making Sandra Meyer is a 42 y.o. female here presenting with possible anaphylaxis to alpha gal.  Patient ingested some seasoning with meat in it.  Patient has severe allergy.  Patient had a seizure-like activity but is awake and per the husband, this is how she presents with her allergic reaction.  Plan to get CBC and CMP and give IM epi and Solu-Medrol and Benadryl  10:28 PM Patient is persistently hypotensive with blood pressures in the 70s to 80s despite 3 L of normal saline bolus.  Given concern for possible anaphylaxis, will start epi drip.  I consulted ICU to see patient.   Problems Addressed: Anaphylaxis, initial encounter: acute illness or injury  Amount and/or Complexity of Data Reviewed Labs: ordered.  Decision-making details documented in ED Course. Radiology: ordered and independent interpretation performed. Decision-making details documented in ED Course. ECG/medicine tests: ordered.  Risk Prescription drug management. Decision regarding hospitalization.    Final Clinical Impression(s) / ED Diagnoses Final diagnoses:  None    Rx / DC Orders ED Discharge Orders     None         Drenda Freeze, MD 06/19/22 2238

## 2022-06-19 NOTE — ED Triage Notes (Signed)
Pt bib [facility] from home for allergic reaction. Hx of alpha gal and pt ate red meat at approx 1830. Vomiting episode along with convulsions started at approx 1930. Family states that episodes typically only last 30 minutes but this episode lasted longer. Per ems pt had no rash/hives on their arrival and is alert and responding verbally during convulsions but not able to answer orientation questions. 5mg  versed given IM and 2.5mg  versed given IV.

## 2022-06-19 NOTE — ED Provider Notes (Signed)
  Provider Note MRN:  [id number]  Arrival date & time: 06/19/22    ED Course and Medical Decision Making  Assumed care from Dr. Darl Householder at shift change.  Concern for shock related to anaphylaxis, alpha gal allergy.  On epi drip, awaiting intensivist recommendations.  11:30 PM update: On my assessment patient is sleeping comfortably, blood pressures 70W to 90 systolic on 1 mcg/min of epinephrine.  .Critical Care  Performed by: Maudie Flakes, MD Authorized by: Maudie Flakes, MD   Critical care provider statement:    Critical care time (minutes):  35   Critical care was necessary to treat or prevent imminent or life-threatening deterioration of the following conditions:  Shock   Critical care was time spent personally by me on the following activities:  Development of treatment plan with patient or surrogate, discussions with consultants, evaluation of patient's response to treatment, examination of patient, ordering and review of laboratory studies, ordering and review of radiographic studies, ordering and performing treatments and interventions, pulse oximetry, re-evaluation of patient's condition and review of old charts   Final Clinical Impressions(s) / ED Diagnoses     ICD-10-CM   1. Anaphylaxis, initial encounter  T78.[place]       ED Discharge Orders     None       Discharge Instructions   None     Barth Kirks. Sedonia Small, [place] [email or web address]    Maudie Flakes, MD 06/20/22 [phone]

## 2022-06-19 NOTE — H&P (Addendum)
NAME:  Sandra Meyer, MRN:  [id number], DOB:  [birth year], LOS: 0 ADMISSION DATE:  06/19/2022, CONSULTATION DATE:  1/30 REFERRING MD:  Dr. Darl Householder, CHIEF COMPLAINT:  anaphylaxis   History of Present Illness:  42 year old female with past medical history as below, which is significant for alpha gal allergy diagnosed in approximately 2018.  She is followed at [facility] where she has been placed on prophylactic medication in the past but did not continue with these due to weight gain.  Husband reports when she does have exposure to red meat even fumes she typically develops GI symptoms including vomiting and diarrhea which typically self resolved.  She also will occasionally have convulsive movements, which they have been told by the allergist is quite rare but can happen.  She was in her usual state of health until 1/30 after eating dinner.  Husband states they had something like [place] sausage for dinner although is not totally sure what it was.  Shortly after she has to go lay down, but developed convulsive movements.  When they did not self resolve EMS was called.  EMS administered 2 doses of Versed IV and route to the emergency department.  In the emergency department she was treated for allergic reaction with Solu-Medrol and Benadryl.  Was also given IM epinephrine when she developed a rash.  She then developed hypotension despite 3 L IV fluids and was started on epinephrine infusion.  PCCM is been asked to evaluate for admission.  Pertinent  Medical History   has a past medical history of Allergy to alpha-gal, AMA (advanced maternal age) multigravida 35+, and Anemia.   Significant Hospital Events: Including procedures, antibiotic start and stop dates in addition to other pertinent events   1/30 admitted for anaphylactic shock on epi infusion  Interim History / Subjective:    Objective   Blood pressure (!) 96/58, pulse 90, temperature 97.8 F (36.6 C), temperature source Temporal, resp. rate  17, height 5\' 11"  (1.803 m), weight 74.8 kg, SpO2 95 %.        Intake/Output Summary (Last 24 hours) at 06/19/2022 2317 Last data filed at 06/19/2022 2303 Gross per 24 hour  Intake 3000 ml  Output --  Net 3000 ml   Filed Weights   06/19/22 2120  Weight: 74.8 kg    Examination: General: Thin adult female in NAD HENT: [place]/AT, PERRL, no JVD Lungs: Clear bilateral breat sounds.  Cardiovascular: RRR, no MRG Abdomen: Soft, NT, ND Extremities: No rash Neuro: Somnolent, will follow commands. Frequent convulsive movement. No clear tonic/clonic or organized seizure type movements identified. More like short burst shaking. Mostly in lower extremities, but occasionally in the upper extremities as well.    Resolved Hospital Problem list     Assessment & Plan:   Anaphylactic shock secondary to alpha gal allergy:  - Admit to ICU for close monitoring - s/p 3 L IVF - Continue epinephrine infusion. Currently at 0.5 mcg. Will ask RN to place IV watch.  - Pepcid, benadryl given. Poor evidence for continued use.  - Steroids - MAP goal 65. Patient at baseline has low BP  ~ "100/60" - No meat products in the room - Frequent neuro checks and airway monitoring  Possible seizure: - s/p versed x 2 - EEG   Best Practice (right click and "Reselect all SmartList Selections" daily)   Diet/type: NPO DVT prophylaxis: SCD GI prophylaxis: N/A Lines: N/A Foley:  N/A Code Status:  full code Last date of multidisciplinary goals of care discussion [  ]  Labs   CBC: Recent Labs  Lab 06/19/22 2133  WBC 12.5*  NEUTROABS 7.9*  HGB 12.2  HCT 36.2  MCV 93.3  PLT 035    Basic Metabolic Panel: Recent Labs  Lab 06/19/22 2133  NA 139  K 3.7  CL 108  CO2 20*  GLUCOSE 115*  BUN 11  CREATININE 0.85  CALCIUM 8.8*   GFR: Estimated Creatinine Clearance: 95.4 mL/min (by C-G formula based on SCr of 0.85 mg/dL). Recent Labs  Lab 06/19/22 2133  WBC 12.5*    Liver Function Tests: Recent  Labs  Lab 06/19/22 2133  AST 21  ALT 12  ALKPHOS 41  BILITOT 0.7  PROT 5.6*  ALBUMIN 3.6   No results for input(s): "LIPASE", "AMYLASE" in the last 168 hours. No results for input(s): "AMMONIA" in the last 168 hours.  ABG No results found for: "PHART", "PCO2ART", "PO2ART", "HCO3", "TCO2", "ACIDBASEDEF", "O2SAT"   Coagulation Profile: No results for input(s): "INR", "PROTIME" in the last 168 hours.  Cardiac Enzymes: No results for input(s): "CKTOTAL", "CKMB", "CKMBINDEX", "TROPONINI" in the last 168 hours.  HbA1C: No results found for: "HGBA1C"  CBG: No results for input(s): "GLUCAP" in the last 168 hours.  Review of Systems:   Patient is encephalopathic and/or intubated. Therefore history has been obtained from chart review.   Past Medical History:  She,  has a past medical history of Allergy to alpha-gal, AMA (advanced maternal age) multigravida 35+, and Anemia.   Surgical History:   Past Surgical History:  Procedure Laterality Date   CESAREAN SECTION     TONSILLECTOMY     TUBAL LIGATION Bilateral 02/21/2014   Procedure: POST PARTUM TUBAL LIGATION;  Surgeon: Mora Bellman, MD;  Location: [facility];  Service: Gynecology;  Laterality: Bilateral;     Social History:   reports that she has been smoking cigarettes. She has been smoking an average of .25 packs per day. She has never used smokeless tobacco. She reports that she does not drink alcohol and does not use drugs.   Family History:  Her family history includes Cancer in her father; Hypertension in her mother; Varicose Veins in her mother.   Allergies Allergies  Allergen Reactions   Codeine Anaphylaxis   Penicillins Nausea And Vomiting and Other (See Comments)    Throat burning. Able to to Keflex.   Alpha-Gal      Home Medications  Prior to Admission medications   Medication Sig Start Date End Date Taking? Authorizing Provider  meloxicam (MOBIC) 7.5 MG tablet Take 1 tablet (7.5 mg total) by mouth daily.  04/04/19   Tasia Catchings, Amy V, PA-C  triamcinolone cream (KENALOG) 0.1 % Apply 1 application topically 2 (two) times daily. 09/18/20   Janith Lima, PA-C     Critical care time: 39 minutes     Georgann Housekeeper, AGACNP-BC [facility]  See Amion for personal pager PCCM on call pager [phone] until 7pm. Please call Elink 7p-7a. [phone]  06/19/2022 11:41 PM

## 2022-06-19 NOTE — ED Notes (Signed)
MD informed of pt's BP. Additional NS bolus ordered and started.

## 2022-06-20 ENCOUNTER — Inpatient Hospital Stay (HOSPITAL_COMMUNITY): Payer: Medicaid Other

## 2022-06-20 DIAGNOSIS — R569 Unspecified convulsions: Secondary | ICD-10-CM | POA: Diagnosis not present

## 2022-06-20 DIAGNOSIS — I959 Hypotension, unspecified: Secondary | ICD-10-CM | POA: Diagnosis not present

## 2022-06-20 DIAGNOSIS — T781XXA Other adverse food reactions, not elsewhere classified, initial encounter: Secondary | ICD-10-CM

## 2022-06-20 LAB — URINALYSIS, ROUTINE W REFLEX MICROSCOPIC
Bacteria, UA: NONE SEEN
Bilirubin Urine: NEGATIVE
Glucose, UA: NEGATIVE mg/dL
Hgb urine dipstick: NEGATIVE
Ketones, ur: 5 mg/dL — AB
Leukocytes,Ua: NEGATIVE
Nitrite: NEGATIVE
Protein, ur: NEGATIVE mg/dL
Specific Gravity, Urine: 1.011 (ref 1.005–1.030)
pH: 7 (ref 5.0–8.0)

## 2022-06-20 LAB — BASIC METABOLIC PANEL
Anion gap: 8 (ref 5–15)
BUN: 11 mg/dL (ref 6–20)
CO2: 17 mmol/L — ABNORMAL LOW (ref 22–32)
Calcium: 8.1 mg/dL — ABNORMAL LOW (ref 8.9–10.3)
Chloride: 111 mmol/L (ref 98–111)
Creatinine, Ser: 0.82 mg/dL (ref 0.44–1.00)
GFR, Estimated: >60 mL/min (ref >60)
Glucose, Bld: 197 mg/dL — ABNORMAL HIGH (ref 70–99)
Potassium: 3.7 mmol/L (ref 3.5–5.1)
Sodium: 136 mmol/L (ref 135–145)

## 2022-06-20 LAB — CBC
HCT: 37.6 % (ref 36.0–46.0)
Hemoglobin: 12.3 g/dL (ref 12.0–15.0)
MCH: 30.6 pg (ref 26.0–34.0)
MCHC: 32.7 g/dL (ref 30.0–36.0)
MCV: 93.5 fL (ref 80.0–100.0)
Platelets: 225 10*3/uL (ref 150–400)
RBC: 4.02 MIL/uL (ref 3.87–5.11)
RDW: 13 % (ref 11.5–15.5)
WBC: 8.6 10*3/uL (ref 4.0–10.5)
nRBC: 0 % (ref 0.0–0.2)

## 2022-06-20 LAB — RAPID URINE DRUG SCREEN, HOSP PERFORMED
Amphetamines: NOT DETECTED
Barbiturates: NOT DETECTED
Benzodiazepines: POSITIVE — AB
Cocaine: NOT DETECTED
Opiates: NOT DETECTED
Tetrahydrocannabinol: POSITIVE — AB

## 2022-06-20 LAB — PHOSPHORUS: Phosphorus: 2.4 mg/dL — ABNORMAL LOW (ref 2.5–4.6)

## 2022-06-20 LAB — MAGNESIUM: Magnesium: 1.7 mg/dL (ref 1.7–2.4)

## 2022-06-20 LAB — HIV ANTIBODY (ROUTINE TESTING W REFLEX): HIV Screen 4th Generation wRfx: NONREACTIVE

## 2022-06-20 LAB — MRSA NEXT GEN BY PCR, NASAL: MRSA by PCR Next Gen: NOT DETECTED

## 2022-06-20 LAB — LACTIC ACID, PLASMA: Lactic Acid, Venous: 1.1 mmol/L (ref 0.5–1.9)

## 2022-06-20 MED ORDER — ORAL CARE MOUTH RINSE: 15.0000 mL | OROMUCOSAL | Status: DC | PRN

## 2022-06-20 MED ORDER — KETOROLAC TROMETHAMINE 15 MG/ML IJ SOLN
15.0000 mg | Freq: Three times a day (TID) | INTRAMUSCULAR | Status: DC | PRN
  Administered 2022-06-20: 15 mg via INTRAVENOUS
  Filled 2022-06-20: qty 1

## 2022-06-20 MED ORDER — OXYCODONE HCL 5 MG PO TABS: 5.0000 mg | ORAL_TABLET | Freq: Four times a day (QID) | ORAL | Status: DC | PRN

## 2022-06-20 MED ORDER — POTASSIUM CHLORIDE 10 MEQ/100ML IV SOLN: 10.0000 meq | INTRAVENOUS | Status: DC

## 2022-06-20 MED ORDER — FAMOTIDINE 20 MG PO TABS
20.0000 mg | ORAL_TABLET | Freq: Two times a day (BID) | ORAL | Status: DC
  Administered 2022-06-20 (×2): 20 mg via ORAL
  Filled 2022-06-20 (×2): qty 1

## 2022-06-20 MED ORDER — MAGNESIUM SULFATE 2 GM/50ML IV SOLN
2.0000 g | Freq: Once | INTRAVENOUS | Status: AC
  Administered 2022-06-20: 2 g via INTRAVENOUS
  Filled 2022-06-20: qty 50

## 2022-06-20 MED ORDER — POTASSIUM CHLORIDE CRYS ER 20 MEQ PO TBCR
20.0000 meq | EXTENDED_RELEASE_TABLET | Freq: Once | ORAL | Status: AC
  Administered 2022-06-20: 20 meq via ORAL
  Filled 2022-06-20: qty 1

## 2022-06-20 MED ORDER — ACETAMINOPHEN 325 MG PO TABS
650.0000 mg | ORAL_TABLET | Freq: Four times a day (QID) | ORAL | Status: DC | PRN
  Administered 2022-06-20 – 2022-06-21 (×2): 650 mg via ORAL
  Filled 2022-06-20 (×2): qty 2

## 2022-06-20 MED ORDER — CHLORHEXIDINE GLUCONATE CLOTH 2 % EX PADS
6.0000 | MEDICATED_PAD | Freq: Every day | CUTANEOUS | Status: DC
  Administered 2022-06-20: 6 via TOPICAL

## 2022-06-20 NOTE — Evaluation (Signed)
Physical Therapy Evaluation Patient Details Name: Miski Feldpausch MRN: [id number] DOB: 05-24-1980 Today's Date: 06/20/2022  History of Present Illness  42 yo female admitted for anaphylactic shock with convulsions PMH PTSD, anxiety alpha galactosidase allergy since 2018, anemia smoker  Clinical Impression  Pt is at or close to baseline functioning and should be safe at home . There are no further acute PT needs.  Will sign off at this time.        Recommendations for follow up therapy are one component of a multi-disciplinary discharge planning process, led by the attending physician.  Recommendations may be updated based on patient status, additional functional criteria and insurance authorization.  Follow Up Recommendations No PT follow up      Assistance Recommended at Discharge PRN  Patient can return home with the following       Equipment Recommendations None recommended by PT  Recommendations for Other Services       Functional Status Assessment Patient has had a recent decline in their functional status and demonstrates the ability to make significant improvements in function in a reasonable and predictable amount of time.     Precautions / Restrictions Precautions Precautions: Fall Precaution Comments: seizure Restrictions Weight Bearing Restrictions: No      Mobility  Bed Mobility Overal bed mobility: Independent                  Transfers Overall transfer level: Modified independent Equipment used: None               General transfer comment: provided assist with line management and IV pole    Ambulation/Gait Ambulation/Gait assistance: Independent Gait Distance (Feet): 800 Feet Assistive device: None Gait Pattern/deviations: WFL(Within Functional Limits)   Gait velocity interpretation: >2.62 ft/sec, indicative of community ambulatory   General Gait Details: pt able to attain age appropriate speeds and handle moderate challenges to  balance including abrupt directional/speed changes, jumping/stepping over obstacles and going up stairs with/without rails.  Stairs Stairs: Yes Stairs assistance: Modified independent (Device/Increase time) Stair Management: No rails, One rail Right, Alternating pattern, Forwards Number of Stairs: 20 General stair comments: safe with stairs  Wheelchair Mobility    Modified Rankin (Stroke Patients Only)       Balance Overall balance assessment: Modified Independent                                           Pertinent Vitals/Pain Pain Assessment Pain Assessment: Faces Faces Pain Scale: No hurt    Home Living Family/patient expects to be discharged to:: Private residence Living Arrangements: Spouse/significant other;Children (2 yonger children at home) Available Help at Discharge: Family Type of Home: House Home Access: Stairs to enter Entrance Stairs-Rails: None Entrance Stairs-Number of Steps: 2   Home Layout: One level Home Equipment: None Additional Comments: 4 dogs ( 3 inside)    Prior Function Prior Level of Function : Independent/Modified Independent;Driving;Working/employed               ADLs Comments: 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, works as Hospital doctor   Dominant Hand: Right    Extremity/Trunk Assessment   Upper Extremity Assessment Upper Extremity Assessment: Overall WFL for tasks assessed    Lower Extremity Assessment Lower Extremity Assessment: Overall WFL for tasks assessed    Cervical / Trunk Assessment  Cervical / Trunk Assessment: Normal  Communication   Communication: Expressive difficulties;Other (comment) (reports speech is slurred and slow)  Cognition Arousal/Alertness: Awake/alert Behavior During Therapy: WFL for tasks assessed/performed Overall Cognitive Status: Within Functional Limits for tasks assessed                                  General Comments: Slower processing speed noted and responses        General Comments General comments (skin integrity, edema, etc.): vss, pt improved over morning eval session with OT    Exercises     Assessment/Plan    PT Assessment Patient does not need any further PT services  PT Problem List         PT Treatment Interventions      PT Goals (Current goals can be found in the Care Plan section)  Acute Rehab PT Goals Patient Stated Goal: home today PT Goal Formulation: All assessment and education complete, DC therapy    Frequency       Co-evaluation               AM-PAC PT "6 Clicks" Mobility  Outcome Measure Help needed turning from your back to your side while in a flat bed without using bedrails?: None Help needed moving from lying on your back to sitting on the side of a flat bed without using bedrails?: None Help needed moving to and from a bed to a chair (including a wheelchair)?: None Help needed standing up from a chair using your arms (e.g., wheelchair or bedside chair)?: None Help needed to walk in hospital room?: None Help needed climbing 3-5 steps with a railing? : None 6 Click Score: 24    End of Session   Activity Tolerance: Patient tolerated treatment well Patient left: in bed Nurse Communication: Mobility status      Time: 6433-2951 PT Time Calculation (min) (ACUTE ONLY): 19 min   Charges:   PT Evaluation $PT Eval Low Complexity: 1 Low          06/20/2022  Ginger Carne., PT Acute Rehabilitation Services [phone]  (office)  Tessie Fass Jacari Iannello 06/20/2022, 5:58 PM

## 2022-06-20 NOTE — Evaluation (Signed)
Occupational Therapy Evaluation Patient Details Name: Sandra Meyer MRN: [id number] DOB: 1980/10/21 Today's Date: 06/20/2022   History of Present Illness 42 yo female admitted for anaphylactic shock with convulsions PMH PTSD, anxiety alpha galactosidase allergy since 2018, anemia smoker   Clinical Impression   Pt agreeable to participate in OT evaluation and Mother present during session. Pt reports that prior to admit she is independent with all ADL and IADL tasks living with her husband and 2 children. Currently, pt is functioning close to baseline. She is experiencing some fatigue, slower motor coordination, and unsteadiness since being in bed. At this time, I do not foresee any follow up OT needs when discharged. Recommend that pt is mobilized to and from the bathroom/recliner with nursing throughout the day to continue and increase activity gradually. Patient evaluated by Occupational Therapy with no further acute OT needs identified. All education has been completed and the patient has no further questions. See below for any follow-up Occupational Therapy or equipment needs. OT is signing off. Thank you for this referral.       Recommendations for follow up therapy are one component of a multi-disciplinary discharge planning process, led by the attending physician.  Recommendations may be updated based on patient status, additional functional criteria and insurance authorization.   Follow Up Recommendations  No OT follow up     Assistance Recommended at Discharge PRN  Patient can return home with the following Assist for transportation;Assistance with cooking/housework    Functional Status Assessment  Patient has not had a recent decline in their functional status  Equipment Recommendations  None recommended by OT       Precautions / Restrictions Precautions Precautions: Fall Precaution Comments: seizure Restrictions Weight Bearing Restrictions: No      Mobility Bed  Mobility Overal bed mobility: Independent    Transfers Overall transfer level: Modified independent Equipment used: None     General transfer comment: provided assist with line management and IV pole      Balance Overall balance assessment: Mild deficits observed, not formally tested (slight sway posteriorly noted during functional mobility.)             ADL either performed or assessed with clinical judgement   ADL Overall ADL's : Needs assistance/impaired     Grooming: Wash/dry hands;Wash/dry face;Oral care;Standing;Independent   Upper Body Bathing: Modified independent;Standing   Lower Body Bathing: Modified independent;Sit to/from stand;Sitting/lateral leans   Upper Body Dressing : Set up;Sitting (assist due to multiple lines)   Lower Body Dressing: Modified independent;Sitting/lateral leans Lower Body Dressing Details (indicate cue type and reason): don socks with figure 4 cross, slow / increased time Toilet Transfer: Min guard;Ambulation (no physical assist. Assist with line management and IV pole)   Toileting- Clothing Manipulation and Hygiene: Independent;Sit to/from stand         General ADL Comments: Min guard provided during standing ADL tasks due to slight sway posteriorly. Able to maintain balance.     Vision Baseline Vision/History: 1 Wears glasses Ability to See in Adequate Light: 0 Adequate Patient Visual Report: Blurring of vision (no glasses present and typically does not wear them so some baseline blurred vision) Additional Comments: reports suppose to wear glasses all the time but doesnt ( including driving)     Perception Perception Perception Tested?: Yes Comments: WFL       Pertinent Vitals/Pain Pain Assessment Pain Assessment: No/denies pain     Hand Dominance Right   Extremity/Trunk Assessment Upper Extremity Assessment Upper Extremity Assessment: Generalized  weakness;RUE deficits/detail;LUE deficits/detail RUE Deficits /  Details: A/ROM WFL in all ranges. 4/5 MMT shoulder in all ranges. Decreased gross grasp LUE Deficits / Details: A/ROM WFL in all ranges. 4/5 MMT shoulder in all ranges. Decreased gross grasp   Lower Extremity Assessment Lower Extremity Assessment: Defer to PT evaluation   Cervical / Trunk Assessment Cervical / Trunk Assessment: Normal   Communication Communication Communication: Expressive difficulties;Other (comment) (reports speech is slurred and slow)   Cognition     General Comments: Slower processing speed noted and responses     General Comments  BP monitored during session. Remained soft and stable            Home Living Family/patient expects to be discharged to:: Private residence Living Arrangements: Spouse/significant other;Children (2 yonger children at home) Available Help at Discharge: Family Type of Home: House Home Access: Stairs to enter Technical brewer of Steps: 2 Entrance Stairs-Rails: None Home Layout: One level     Bathroom Shower/Tub: Teacher, early years/pre: Standard     Home Equipment: None   Additional Comments: 4 dogs ( 3 inside)      Prior Functioning/Environment Prior Level of Function : Independent/Modified Independent;Driving;Working/employed   ADLs Comments: 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, works as Architectural technologist Problem List: Decreased strength      OT Treatment/Interventions:   Eval only   OT Goals(Current goals can be found in the care plan section) Acute Rehab OT Goals Patient Stated Goal: to get out of bed  OT Frequency:  Eval only       AM-PAC OT "6 Clicks" Daily Activity     Outcome Measure Help from another person eating meals?: None Help from another person taking care of personal grooming?: None Help from another person toileting, which includes using toliet, bedpan, or urinal?: None Help from another person bathing (including washing,  rinsing, drying)?: None Help from another person to put on and taking off regular upper body clothing?: None Help from another person to put on and taking off regular lower body clothing?: None 6 Click Score: 24   End of Session    Activity Tolerance: Patient tolerated treatment well Patient left: in chair;with call bell/phone within reach;with chair alarm set;with family/visitor present  OT Visit Diagnosis: Muscle weakness (generalized) (M62.81);History of falling (Z91.81)                Time: 0940-1020 OT Time Calculation (min): 40 min Charges:  OT General Charges $OT Visit: 1 Visit OT Evaluation $OT Eval Moderate Complexity: 1 Mod OT Treatments $Self Care/Home Management : 23-37 mins  Ailene Ravel, OTR/L,CBIS  Supplemental OT - [facility] and [facility] Secure Chat Preferred    Ovida Delagarza, Clarene Duke 06/20/2022, 10:33 AM

## 2022-06-20 NOTE — Progress Notes (Signed)
NAME:  Sandra Meyer, MRN:  [id number], DOB:  Feb 18, 1981, LOS: 1 ADMISSION DATE:  06/19/2022, CONSULTATION DATE:  1/30 REFERRING MD:  Dr. Darl Householder, CHIEF COMPLAINT:  anaphylaxis   History of Present Illness:  42 year old female with past medical history as below, which is significant for alpha gal allergy diagnosed in approximately 2018.  She is followed at [facility] where she has been placed on prophylactic medication in the past but did not continue with these due to weight gain.  Husband reports when she does have exposure to red meat even fumes she typically develops GI symptoms including vomiting and diarrhea which typically self resolved.  She also will occasionally have convulsive movements, which they have been told by the allergist is quite rare but can happen.  She was in her usual state of health until 1/30 after eating dinner.  Husband states they had something like [place] sausage for dinner although is not totally sure what it was.  Shortly after she has to go lay down, but developed convulsive movements.  When they did not self resolve EMS was called.  EMS administered 2 doses of Versed IV and route to the emergency department.  In the emergency department she was treated for allergic reaction with Solu-Medrol and Benadryl.  Was also given IM epinephrine when she developed a rash.  She then developed hypotension despite 3 L IV fluids and was started on epinephrine infusion.  PCCM is been asked to evaluate for admission.  Pertinent  Medical History   has a past medical history of Allergy to alpha-gal, AMA (advanced maternal age) multigravida 35+, and Anemia.   Significant Hospital Events: Including procedures, antibiotic start and stop dates in addition to other pertinent events   1/30 admitted for anaphylactic shock on epi infusion 1/31 cont on epi gtt. Adv diet.    Interim History / Subjective:   NAEO   No sz on EEG   Says she is hungry, thirsty and wants to walk around    Objective   Blood pressure 100/63, pulse 74, temperature 98.5 F (36.9 C), temperature source Axillary, resp. rate 19, height 5\' 11"  (1.803 m), weight 76.8 kg, SpO2 96 %.        Intake/Output Summary (Last 24 hours) at 06/20/2022 1043 Last data filed at 06/20/2022 1000 Gross per 24 hour  Intake 3158.65 ml  Output 925 ml  Net 2233.65 ml   Filed Weights   06/19/22 2120 06/20/22 0110  Weight: 74.8 kg 76.8 kg    Examination: General: chronically ill appearing F reclined in bed NAD  Neuro: Intermittent tremor affecting different extremities throughout exam. 5/5 strength BUE BLE. AAOx3  HENT: NCAT pink mm  Lungs: CTAb on RA  Cardiovascular: rrr s1s2 no rgm  GI: soft ndnt  GU: defer Extremities: no acute joint deformity no cyanosis or clubbing  Skin: c/d/w    Resolved Hospital Problem list     Assessment & Plan:    Hypotension Alpha Gal Syndrome  Tremors/ convulsive movements  Myalgias   -interestingly does not have s/sx anaphylaxis, but is slightly hypotensive. Seems to have baseline SBP low 100s. Wonder if there is a hypovolemic component to her presenting hypotension or if low BP r/t Versed admin'd -doesn't see an allergist anymore. Saw someone through [facility] in 2019 and at that time really wasn't a classic presentation for AGS, and did not have evidence of underlying mast cell disorder.  Saw a different allergist w [facility] in 2021 and labs were more abnormal but still  baseline WNL tryptase. Does not seem to have followed since and is not taking the medications Rx by allergist.   -sounds like she has reported some autonomic lability and has recently seen a cardiologist for HR variability. Hypotension could be autonomic dysfxn  -Family reports she was "seizing" for 3 hours prior to EMS arrival. This happens with all of her alpha gal attacks. In talking with family about this, I am quite confident these are not seizures. It sounds like she tremors and her body moves in a pattern  that is not consistent with sz like activity-- when asked to replicate what it looks like, mom tremors her limbs and flails/wiggles her arms and legs - -there is not a rhythmic nature to muscle movements,  rigidity or hypertonicity, loss of bowel/bladder fxn, gaze deviation etc and pt is communicative (though sounds like sometimes this is with hand gestures, nodding/shaking head and not with words), though after these attacks complete, pt endorses no memory. Query if this is possibly related to anxiety or psychosomatic process otherwise?   In review of last allergist appointment in 2021 I did read:  Previous reports ([facility] [phone]) have demonstrated that patients with IgE antibodies to galactose-a-1,3-galactose are at risk for delayed anaphylaxis, angioedema, or urticaria following consumption of beef, pork, or lamb.  P -wean epi to off  -steroids, pepcid -adv diet, will start w clears and adv from there pending tolerance  -if remains stable off epi, hesitant to dc home same day with reports of delayed anaphylaxis and angioedema, though does not seem to be at present  -no meat products / meat derivatives  -would rec that she start seeing an allergist again, seems like AGS is severely impacting her QOL  - optimize lytes (will give mag and K)  -PRN analgesics -could consider outpt neuro for this very unusual tremor    Best Practice (right click and "Reselect all SmartList Selections" daily)   Diet/type: full liquids  DVT prophylaxis: SCD GI prophylaxis: N/A Lines: N/A Foley:  N/A Code Status:  full code Last date of multidisciplinary goals of care discussion [ pt and mother updated at bedside 1/31  ]  Labs   CBC: Recent Labs  Lab 06/19/22 2133 06/20/22 0443  WBC 12.5* 8.6  NEUTROABS 7.9*  --   HGB 12.2 12.3  HCT 36.2 37.6  MCV 93.3 93.5  PLT 206 562    Basic Metabolic Panel: Recent Labs  Lab 06/19/22 2133 06/20/22 0443  NA 139 136  K 3.7 3.7  CL 108 111  CO2 20*  17*  GLUCOSE 115* 197*  BUN 11 11  CREATININE 0.85 0.82  CALCIUM 8.8* 8.1*  MG  --  1.7  PHOS  --  2.4*   GFR: Estimated Creatinine Clearance: 98.9 mL/min (by C-G formula based on SCr of 0.82 mg/dL). Recent Labs  Lab 06/19/22 2133 06/20/22 0005 06/20/22 0443  WBC 12.5*  --  8.6  LATICACIDVEN  --  1.1  --     Liver Function Tests: Recent Labs  Lab 06/19/22 2133  AST 21  ALT 12  ALKPHOS 41  BILITOT 0.7  PROT 5.6*  ALBUMIN 3.6   No results for input(s): "LIPASE", "AMYLASE" in the last 168 hours. No results for input(s): "AMMONIA" in the last 168 hours.  ABG No results found for: "PHART", "PCO2ART", "PO2ART", "HCO3", "TCO2", "ACIDBASEDEF", "O2SAT"   Coagulation Profile: No results for input(s): "INR", "PROTIME" in the last 168 hours.  Cardiac Enzymes: No results for input(s): "CKTOTAL", "CKMB", "  CKMBINDEX", "TROPONINI" in the last 168 hours.  HbA1C: No results found for: "HGBA1C"  CBG: No results for input(s): "GLUCAP" in the last 168 hours.  CRITICAL CARE Performed by: Cristal Generous   Total critical care time: 35 minutes  Critical care time was exclusive of separately billable procedures and treating other patients. Critical care was necessary to treat or prevent imminent or life-threatening deterioration.  Critical care was time spent personally by me on the following activities: development of treatment plan with patient and/or surrogate as well as nursing, discussions with consultants, evaluation of patient's response to treatment, examination of patient, obtaining history from patient or surrogate, ordering and performing treatments and interventions, ordering and review of laboratory studies, ordering and review of radiographic studies, pulse oximetry and re-evaluation of patient's condition.   Eliseo Gum MSN, AGACNP-BC [place] for pager  06/20/2022, 10:43 AM

## 2022-06-20 NOTE — Procedures (Signed)
Patient Name: Sandra Meyer  MRN: [id number]  Epilepsy Attending: Lora Havens  Referring Physician/Provider: Corey Harold, NP  Date: 06/20/2022 Duration: 27.29 mins  Patient history: 42yo F with seizure like activity. EEG to evaluate for seizure  Level of alertness: Awake  AEDs during EEG study: None  Technical aspects: This EEG study was done with scalp electrodes positioned according to the 10-20 International system of electrode placement. Electrical activity was reviewed with band pass filter of 1-70Hz , sensitivity of 7 uV/mm, display speed of 1mm/sec with a 60Hz  notched filter applied as appropriate. EEG data were recorded continuously and digitally stored.  Video monitoring was available and reviewed as appropriate.  Description: The posterior dominant rhythm consists of 9-10 Hz activity of moderate voltage (25-35 uV) seen predominantly in posterior head regions, symmetric and reactive to eye opening and eye closing. Hyperventilation and photic stimulation were not performed.     IMPRESSION: This study is within normal limits. No seizures or epileptiform discharges were seen throughout the recording.  A normal interictal EEG does not exclude the diagnosis of epilepsy.  Monserratt Knezevic Barbra Sarks

## 2022-06-20 NOTE — Progress Notes (Addendum)
eLink Physician-Brief Progress Note Patient Name: Sandra Meyer DOB: Jun 28, 1980 MRN: [id number]   Date of Service  06/20/2022  HPI/Events of Note  Anaphylactic shock evaluation, currently on Epi peripherally, stable mentation, improved rash, receiving steroids and antihistamines  eICU Interventions  No intervention indicated, new patient eval   0322 - rhythmic sporadic movements, eeg in the works. Patient following commands but slow to respond in general. No obvious postictal response, hemodynamically stable. Would continue to observe for now.    0450 - reporting pain which is a positive neurological response. On meloxicam at home. No evidence of bleeding or AKI. Acetaminophen/Ketorolac/Oxy scale entered.  Intervention Category Major Interventions: Shock - evaluation and management Evaluation Type: New Patient Evaluation  [name] 06/20/2022, 1:13 AM

## 2022-06-20 NOTE — Discharge Summary (Incomplete)
Physician Discharge Summary  Patient ID: Sandra Meyer MRN: [id number] DOB/AGE: Nov 14, 1980 42 y.o.  Admit date: 06/19/2022 Discharge date: 06/20/2022  Problem List Principal Problem:   Anaphylactic shock Active Problems:   Seizure ([place])   Hypotension   Allergic reaction to alpha-gal  HPI:  42 yo F with PMH alpha-gal without hs anaphylaxis, anxiety, PTSD, palpitations who was admitted 06/19/22 with concern for allergic reaction to meat exposure with associated involuntary extremity movements and hypotension. The patient's presenting report somewhat has varied between providers but seem that about an hour or two after eating dinner she felt ill and had to lay down and then developed involuntary convulsive movements. It is possible that there was meat product in the seasoning of the dinner, or in the dinner, but pt does not know.  Shaking movements which the patient describes as "seizures" reportedly continued for 3 hours non-stop, prompting EMS dispatch. Patient was notably apparently coherent during this episode and communicative, using mostly gestures, and was able to hold a cup/drink during this "seizure"-like period. She did receive versed with EMS with no change in her movements. She was give solumedrol, benadryl and IM epi in the ED. She was hypotensive and started on low dose epi gtt and admitted to ICU with concern for possible developing anaphylactic shock. No rash, no facial swelling.   Hospital Course: 1/30 given versed with EMS for extremity movements in case seizure. Hypotensive in the ED, given solumedrol benadryl, IM epi and started on epi gtt  1/31 EEG without seizures. weaned off epi gtt. Diet advanced. Transferred out of ICU.   Exam at discharge: *** General Neuro HEENT CV Pulm GI Extremity   Plan at discharge:  Alpha-gal syndrome Hypotension, without anaphylaxis  Tremors/involuntary movements (not c/w seizures)  PTSD Anxiety OCD  P -recommend seeing her  allergist at [facility] to further evaluate/manage her AGS which sounds to be causing significant distress in her life. -encourage to re-establish with therapist when able  -encourage maintaining adequate PO intake  -epi pen at home.  -discussed instructions for return ED presentation if needed  -*** pred, pepcid     Labs at discharge Lab Results  Component Value Date   CREATININE 0.82 06/20/2022   BUN 11 06/20/2022   NA 136 06/20/2022   K 3.7 06/20/2022   CL 111 06/20/2022   CO2 17 (L) 06/20/2022   Lab Results  Component Value Date   WBC 8.6 06/20/2022   HGB 12.3 06/20/2022   HCT 37.6 06/20/2022   MCV 93.5 06/20/2022   PLT 225 06/20/2022   Lab Results  Component Value Date   ALT 12 06/19/2022   AST 21 06/19/2022   ALKPHOS 41 06/19/2022   BILITOT 0.7 06/19/2022   No results found for: "INR", "PROTIME"  Current radiology studies EEG adult  Result Date: 06/20/2022 Lora Havens, MD     06/20/2022  4:42 AM Patient Name: Sandra Meyer MRN: [id number] Epilepsy Attending: Lora Havens Referring Physician/Provider: Corey Harold, NP Date: 06/20/2022 Duration: 27.29 mins Patient history: 42yo F with seizure like activity. EEG to evaluate for seizure Level of alertness: Awake AEDs during EEG study: None Technical aspects: This EEG study was done with scalp electrodes positioned according to the 10-20 International system of electrode placement. Electrical activity was reviewed with band pass filter of 1-70Hz , sensitivity of 7 uV/mm, display speed of 14mm/sec with a 60Hz  notched filter applied as appropriate. EEG data were recorded continuously and digitally stored.  Video monitoring was available and reviewed  as appropriate. Description: The posterior dominant rhythm consists of 9-10 Hz activity of moderate voltage (25-35 uV) seen predominantly in posterior head regions, symmetric and reactive to eye opening and eye closing. Hyperventilation and photic stimulation were not performed.    IMPRESSION: This study is within normal limits. No seizures or epileptiform discharges were seen throughout the recording. A normal interictal EEG does not exclude the diagnosis of epilepsy. Priyanka Barbra Sarks   CT HEAD WO CONTRAST (5MM)  Result Date: 06/19/2022 CLINICAL DATA:  Altered mental status and recent syncopal event EXAM: CT HEAD WITHOUT CONTRAST TECHNIQUE: Contiguous axial images were obtained from the base of the skull through the vertex without intravenous contrast. RADIATION DOSE REDUCTION: This exam was performed according to the departmental dose-optimization program which includes automated exposure control, adjustment of the mA and/or kV according to patient size and/or use of iterative reconstruction technique. COMPARISON:  12/30/2017 FINDINGS: Brain: No evidence of acute infarction, hemorrhage, hydrocephalus, extra-axial collection or mass lesion/mass effect. Vascular: No hyperdense vessel or unexpected calcification. Skull: Normal. Negative for fracture or focal lesion. Sinuses/Orbits: No acute finding. Other: None. IMPRESSION: No acute intracranial abnormality noted. Electronically Signed   By: Inez Catalina M.D.   On: 06/19/2022 23:56   DG CHEST PORT 1 VIEW  Result Date: 06/19/2022 CLINICAL DATA:  Concern for pneumonia. EXAM: PORTABLE CHEST 1 VIEW COMPARISON:  None Available. FINDINGS: The heart size and mediastinal contours are within normal limits. Low lung volumes without evidence of focal consolidation or pleural effusion. The visualized skeletal structures are unremarkable. IMPRESSION: Low lung volumes without evidence of acute cardiopulmonary process. Electronically Signed   By: Keane Police D.O.   On: 06/19/2022 22:58    Disposition:     Allergies as of 06/20/2022       Reactions   Codeine Anaphylaxis   Glycerin Other (See Comments)   Animal-product allergy, patient has alpha-gal   Meat Extract Other (See Comments)   Patient had alpha-gal, past convulsions to  animal-products   Penicillins Nausea And Vomiting, Other (See Comments)   Throat burning. Able to to Keflex.   Alpha-gal      Med Rec must be completed prior to using this [facility]***         Discharged Condition: stable  Time spent on discharge greater than 40 minutes.  Vital signs at Discharge. Temp:  [97.7 F (36.5 C)-98.7 F (37.1 C)] 98.7 F (37.1 C) (01/31 1447) Pulse Rate:  [63-128] 63 (01/31 1447) Resp:  [12-31] 20 (01/31 1447) BP: (74-165)/(48-125) 102/70 (01/31 1447) SpO2:  [92 %-100 %] 98 % (01/31 1447) Weight:  [74.8 kg-76.8 kg] 76.8 kg (01/31 0110)  Eliseo Gum MSN, AGACNP-BC [place] for pager  06/20/2022, 3:23 PM

## 2022-06-20 NOTE — Progress Notes (Signed)
An USGPIV (ultrasound guided PIV) has been placed for short-term vasopressor infusion. A correctly placed ivWatch must be used when administering Vasopressors. Should this treatment be needed beyond 72 hours, central line access should be obtained.  It will be the responsibility of the bedside nurse to follow best practice to prevent extravasations.

## 2022-06-20 NOTE — Progress Notes (Signed)
Stat  EEG complete - results pending.  

## 2022-06-21 DIAGNOSIS — T782XXA Anaphylactic shock, unspecified, initial encounter: Secondary | ICD-10-CM | POA: Diagnosis not present

## 2022-06-21 DIAGNOSIS — Z419 Encounter for procedure for purposes other than remedying health state, unspecified: Secondary | ICD-10-CM | POA: Diagnosis not present

## 2022-06-21 MED ORDER — FAMOTIDINE 20 MG PO TABS: 20.0000 mg | ORAL_TABLET | Freq: Two times a day (BID) | ORAL | 0 refills | Status: AC

## 2022-06-21 MED ORDER — EPINEPHRINE 0.3 MG/0.3ML IJ SOAJ: 0.3000 mg | INTRAMUSCULAR | 1 refills | Status: AC | PRN

## 2022-06-21 NOTE — Discharge Summary (Signed)
Physician Discharge Summary  Sandra Meyer QIO:[id number] DOB: 04-11-1981 DOA: 06/19/2022  PCP: [facility], [place] date: 06/19/2022 Discharge date: 06/21/2022  Admitted From: home Disposition:  home  Recommendations for Outpatient Follow-up:  Follow up with PCP in 1-2 weeks  Home Health: none Equipment/Devices: none  Discharge Condition: stable CODE STATUS: Full code Diet Orders (From admission, onward)     Start     Ordered   06/20/22 1123  Diet regular Room service appropriate? Yes with Assist; Fluid consistency: Thin  Diet effective now       Comments: Alpha Gal -- no meat/meat derivatives.  No milk products.  Question Answer Comment  Room service appropriate? Yes with Assist   Fluid consistency: Thin      06/20/22 1123            HPI: Per admitting MD, 42 year old female with past medical history as below, which is significant for alpha gal allergy diagnosed in approximately 2018.  She is followed at [facility] where she has been placed on prophylactic medication in the past but did not continue with these due to weight gain.  Husband reports when she does have exposure to red meat even fumes she typically develops GI symptoms including vomiting and diarrhea which typically self resolved.  She also will occasionally have convulsive movements, which they have been told by the allergist is quite rare but can happen. She was in her usual state of health until 1/30 after eating dinner.  Husband states they had something like [place] sausage for dinner although is not totally sure what it was.  Shortly after she has to go lay down, but developed convulsive movements.  When they did not self resolve EMS was called.  EMS administered 2 doses of Versed IV and route to the emergency department.  In the emergency department she was treated for allergic reaction with Solu-Medrol and Benadryl.  Was also given IM epinephrine when she developed a rash.  She then  developed hypotension despite 3 L IV fluids and was started on epinephrine infusion.  Hospital Course / Discharge diagnoses: Principal Problem:   Anaphylactic shock Active Problems:   Seizure ([place])   Hypotension   Allergic reaction to alpha-gal  Principal problem Hypotension in the setting of known alpha gal syndrome -with concern for anaphylactic reaction after eating [place] products.  She was a bit surprised since she has tolerated [place] products in the past, but this product may have been different.  She is alpha gal syndrome since 2018, and this appeared after a tick bite.  She was initially placed on epi infusion, and eventually weaned off.  She was transferred to the hospitalist service on 2/1.  She remained stable, has returned to baseline and will be discharged home in stable condition.  Active problems Tremor/convulsive movements -has a history of the same, etiology not entirely clear.  EEG was done and was negative for epileptiform discharges.     Sepsis ruled out   Discharge Instructions   Allergies as of 06/21/2022       Reactions   Codeine Anaphylaxis   Glycerin Other (See Comments)   Animal-product allergy, patient has alpha-gal   Meat Extract Other (See Comments)   Patient had alpha-gal, past convulsions to animal-products   Penicillins Nausea And Vomiting, Other (See Comments)   Throat burning. Able to to Keflex.   Alpha-gal Other (See Comments)   Convulsions  Myalgias  Arthralgias  Medication List     TAKE these medications    EPINEPHrine 0.3 mg/0.3 mL Soaj injection Commonly known as: EpiPen 2-Pak Inject 0.3 mg into the muscle as needed for anaphylaxis.   famotidine 20 MG tablet Commonly known as: PEPCID Take 1 tablet (20 mg total) by mouth 2 (two) times daily.   ibuprofen 200 MG tablet Commonly known as: ADVIL Take 800 mg by mouth daily as needed for headache, mild pain or moderate pain.   NON FORMULARY Take 1 mL by mouth daily as  needed (pain). CBD oil   NON FORMULARY Take 2 each by mouth daily as needed (pain). CBD chews       Consultations: PCCM  Procedures/Studies:  EEG adult  Result Date: 06/20/2022 Lora Havens, MD     06/20/2022  4:42 AM Patient Name: Sandra Meyer MRN: [id number] Epilepsy Attending: Lora Havens Referring Physician/Provider: Corey Harold, NP Date: 06/20/2022 Duration: 27.29 mins Patient history: 42yo F with seizure like activity. EEG to evaluate for seizure Level of alertness: Awake AEDs during EEG study: None Technical aspects: This EEG study was done with scalp electrodes positioned according to the 10-20 International system of electrode placement. Electrical activity was reviewed with band pass filter of 1-70Hz , sensitivity of 7 uV/mm, display speed of 29mm/sec with a 60Hz  notched filter applied as appropriate. EEG data were recorded continuously and digitally stored.  Video monitoring was available and reviewed as appropriate. Description: The posterior dominant rhythm consists of 9-10 Hz activity of moderate voltage (25-35 uV) seen predominantly in posterior head regions, symmetric and reactive to eye opening and eye closing. Hyperventilation and photic stimulation were not performed.   IMPRESSION: This study is within normal limits. No seizures or epileptiform discharges were seen throughout the recording. A normal interictal EEG does not exclude the diagnosis of epilepsy. Priyanka Barbra Sarks   CT HEAD WO CONTRAST (5MM)  Result Date: 06/19/2022 CLINICAL DATA:  Altered mental status and recent syncopal event EXAM: CT HEAD WITHOUT CONTRAST TECHNIQUE: Contiguous axial images were obtained from the base of the skull through the vertex without intravenous contrast. RADIATION DOSE REDUCTION: This exam was performed according to the departmental dose-optimization program which includes automated exposure control, adjustment of the mA and/or kV according to patient size and/or use of iterative  reconstruction technique. COMPARISON:  12/30/2017 FINDINGS: Brain: No evidence of acute infarction, hemorrhage, hydrocephalus, extra-axial collection or mass lesion/mass effect. Vascular: No hyperdense vessel or unexpected calcification. Skull: Normal. Negative for fracture or focal lesion. Sinuses/Orbits: No acute finding. Other: None. IMPRESSION: No acute intracranial abnormality noted. Electronically Signed   By: Inez Catalina M.D.   On: 06/19/2022 23:56   DG CHEST PORT 1 VIEW  Result Date: 06/19/2022 CLINICAL DATA:  Concern for pneumonia. EXAM: PORTABLE CHEST 1 VIEW COMPARISON:  None Available. FINDINGS: The heart size and mediastinal contours are within normal limits. Low lung volumes without evidence of focal consolidation or pleural effusion. The visualized skeletal structures are unremarkable. IMPRESSION: Low lung volumes without evidence of acute cardiopulmonary process. Electronically Signed   By: Keane Police D.O.   On: 06/19/2022 22:58     Subjective: - no chest pain, shortness of breath, no abdominal pain, nausea or vomiting.   Discharge Exam: BP 93/61 (BP Location: Right Arm)   Pulse 66   Temp 97.6 F (36.4 C) (Oral)   Resp 16   Ht 5\' 11"  (1.803 m)   Wt 76.8 kg   SpO2 99%   BMI 23.61 kg/m  General: Pt is alert, awake, not in acute distress Cardiovascular: RRR, S1/S2 +, no rubs, no gallops Respiratory: CTA bilaterally, no wheezing, no rhonchi Abdominal: Soft, NT, ND, bowel sounds + Extremities: no edema, no cyanosis    The results of significant diagnostics from this hospitalization (including imaging, microbiology, ancillary and laboratory) are listed below for reference.     Microbiology: Recent Results (from the past 240 hour(s))  MRSA Next Gen by PCR, Nasal     Status: None   Collection Time: 06/20/22  1:03 AM   Specimen: Nasal Mucosa; Nasal Swab  Result Value Ref Range Status   MRSA by PCR Next Gen NOT DETECTED NOT DETECTED Final    Comment: (NOTE) The  GeneXpert MRSA Assay (FDA approved for NASAL specimens only), is one component of a comprehensive MRSA colonization surveillance program. It is not intended to diagnose MRSA infection nor to guide or monitor treatment for MRSA infections. Test performance is not FDA approved in patients less than 54 years old. Performed at [facility], [place] [address], [place], [place] [address]      Labs: Basic Metabolic Panel: Recent Labs  Lab 06/19/22 2133 06/20/22 0443  NA 139 136  K 3.7 3.7  CL 108 111  CO2 20* 17*  GLUCOSE 115* 197*  BUN 11 11  CREATININE 0.85 0.82  CALCIUM 8.8* 8.1*  MG  --  1.7  PHOS  --  2.4*   Liver Function Tests: Recent Labs  Lab 06/19/22 2133  AST 21  ALT 12  ALKPHOS 41  BILITOT 0.7  PROT 5.6*  ALBUMIN 3.6   CBC: Recent Labs  Lab 06/19/22 2133 06/20/22 0443  WBC 12.5* 8.6  NEUTROABS 7.9*  --   HGB 12.2 12.3  HCT 36.2 37.6  MCV 93.3 93.5  PLT 206 225   CBG: No results for input(s): "GLUCAP" in the last 168 hours. Hgb A1c No results for input(s): "HGBA1C" in the last 72 hours. Lipid Profile No results for input(s): "CHOL", "HDL", "LDLCALC", "TRIG", "CHOLHDL", "LDLDIRECT" in the last 72 hours. Thyroid function studies No results for input(s): "TSH", "T4TOTAL", "T3FREE", "THYROIDAB" in the last 72 hours.  Invalid input(s): "FREET3" Urinalysis    Component Value Date/Time   COLORURINE YELLOW 06/20/2022 [place] 06/20/2022 0057   LABSPEC 1.011 06/20/2022 0057   PHURINE 7.0 06/20/2022 0057   GLUCOSEU NEGATIVE 06/20/2022 0057   HGBUR NEGATIVE 06/20/2022 0057   BILIRUBINUR NEGATIVE 06/20/2022 0057   KETONESUR 5 (A) 06/20/2022 0057   PROTEINUR NEGATIVE 06/20/2022 0057   UROBILINOGEN 0.2 02/11/2014 1328   NITRITE NEGATIVE 06/20/2022 0057   LEUKOCYTESUR NEGATIVE 06/20/2022 0057    FURTHER DISCHARGE INSTRUCTIONS:   Get Medicines reviewed and adjusted: Please take all your medications with you for your next visit  with your Primary MD   Laboratory/radiological data: Please request your Primary MD to go over all hospital tests and procedure/radiological results at the follow up, please ask your Primary MD to get all Hospital records sent to his/her office.   In some cases, they will be blood work, cultures and biopsy results pending at the time of your discharge. Please request that your primary care M.D. goes through all the records of your hospital data and follows up on these results.   Also Note the following: If you experience worsening of your admission symptoms, develop shortness of breath, life threatening emergency, suicidal or homicidal thoughts you must seek medical attention immediately by calling 911 or calling your MD  immediately  if symptoms less severe.   You must read complete instructions/literature along with all the possible adverse reactions/side effects for all the Medicines you take and that have been prescribed to you. Take any new Medicines after you have completely understood and accpet all the possible adverse reactions/side effects.    Do not drive when taking Pain medications or sleeping medications (Benzodaizepines)   Do not take more than prescribed Pain, Sleep and Anxiety Medications. It is not advisable to combine anxiety,sleep and pain medications without talking with your primary care practitioner   Special Instructions: If you have smoked or chewed Tobacco  in the last 2 yrs please stop smoking, stop any regular Alcohol  and or any Recreational drug use.   Wear Seat belts while driving.   Please note: You were cared for by a hospitalist during your hospital stay. Once you are discharged, your primary care physician will handle any further medical issues. Please note that NO REFILLS for any discharge medications will be authorized once you are discharged, as it is imperative that you return to your primary care physician (or establish a relationship with a primary care  physician if you do not have one) for your post hospital discharge needs so that they can reassess your need for medications and monitor your lab values.  Time coordinating discharge: 40 minutes  SIGNED:  Pamella Pert, MD, PhD 06/21/2022, 7:58 AM

## 2022-06-21 NOTE — Discharge Instructions (Signed)
Follow with [facility] in 5-7 days  Please get a complete blood count and chemistry panel checked by your Primary MD at your next visit, and again as instructed by your Primary MD. Please get your medications reviewed and adjusted by your Primary MD.  Please request your Primary MD to go over all Hospital Tests and Procedure/Radiological results at the follow up, please get all Hospital records sent to your Prim MD by signing hospital release before you go home.  In some cases, there will be blood work, cultures and biopsy results pending at the time of your discharge. Please request that your primary care M.D. goes through all the records of your hospital data and follows up on these results.  If you had Pneumonia of Lung problems at the Hospital: Please get a 2 view Chest X ray done in 6-8 weeks after hospital discharge or sooner if instructed by your Primary MD.  If you have Congestive Heart Failure: Please call your Cardiologist or Primary MD anytime you have any of the following symptoms:  1) 3 pound weight gain in 24 hours or 5 pounds in 1 week  2) shortness of breath, with or without a dry hacking cough  3) swelling in the hands, feet or stomach  4) if you have to sleep on extra pillows at night in order to breathe  Follow cardiac low salt diet and 1.5 lit/day fluid restriction.  If you have diabetes Accuchecks 4 times/day, Once in AM empty stomach and then before each meal. Log in all results and show them to your primary doctor at your next visit. If any glucose reading is under 80 or above 300 call your primary MD immediately.  If you have Seizure/Convulsions/Epilepsy: Please do not drive, operate heavy machinery, participate in activities at heights or participate in high speed sports until you have seen by Primary MD or a Neurologist and advised to do so again. Per [place] DMV statutes, patients with seizures are not allowed to drive until  they have been seizure-free for six months.  Use caution when using heavy equipment or power tools. Avoid working on ladders or at heights. Take showers instead of baths. Ensure the water temperature is not too high on the home water heater. Do not go swimming alone. Do not lock yourself in a room alone (i.e. bathroom). When caring for infants or small children, sit down when holding, feeding, or changing them to minimize risk of injury to the child in the event you have a seizure. Maintain good sleep hygiene. Avoid alcohol.   If you had Gastrointestinal Bleeding: Please ask your Primary MD to check a complete blood count within one week of discharge or at your next visit. Your endoscopic/colonoscopic biopsies that are pending at the time of discharge, will also need to followed by your Primary MD.  Get Medicines reviewed and adjusted. Please take all your medications with you for your next visit with your Primary MD  Please request your Primary MD to go over all hospital tests and procedure/radiological results at the follow up, please ask your Primary MD to get all Hospital records sent to his/her office.  If you experience worsening of your admission symptoms, develop shortness of breath, life threatening emergency, suicidal or homicidal thoughts you must seek medical attention immediately by calling 911 or calling your MD immediately  if symptoms less severe.  You must read complete instructions/literature along with all the possible adverse reactions/side effects for all the Medicines   you take and that have been prescribed to you. Take any new Medicines after you have completely understood and accpet all the possible adverse reactions/side effects.   Do not drive or operate heavy machinery when taking Pain medications.   Do not take more than prescribed Pain, Sleep and Anxiety Medications  Special Instructions: If you have smoked or chewed Tobacco  in the last 2 yrs please stop smoking, stop  any regular Alcohol  and or any Recreational drug use.  Wear Seat belts while driving.  Please note You were cared for by a hospitalist during your hospital stay. If you have any questions about your discharge medications or the care you received while you were in the hospital after you are discharged, you can call the unit and asked to speak with the hospitalist on call if the hospitalist that took care of you is not available. Once you are discharged, your primary care physician will handle any further medical issues. Please note that NO REFILLS for any discharge medications will be authorized once you are discharged, as it is imperative that you return to your primary care physician (or establish a relationship with a primary care physician if you do not have one) for your aftercare needs so that they can reassess your need for medications and monitor your lab values.  You can reach the hospitalist office at phone [phone] or fax [phone]   If you do not have a primary care physician, you can call [phone] for a physician referral.  Activity: As tolerated with Full fall precautions use walker/cane & assistance as needed    Diet: regular  Disposition Home   

## 2022-06-21 NOTE — TOC Transition Note (Addendum)
Transition of Care [facility]) - CM/SW Discharge Note   Patient Details  Name: Sandra Meyer MRN: [id number] Date of Birth: [birth year]  [facility]) CM/SW Contact:  Verdell Carmine, RN Phone Number: 06/21/2022, 8:15 AM   Clinical Narrative:     Patient discharging today, no needs identified. Low readmission risk  Final next level of care: Home/Self Care Barriers to Discharge: No Barriers Identified   Patient Goals and CMS Choice      Discharge Placement                         Discharge Plan and Services Additional resources added to the After Visit Summary for                                       Social Determinants of Health (SDOH) Interventions SDOH Screenings   Tobacco Use: High Risk (06/19/2022)     Readmission Risk Interventions     No data to display

## 2022-06-22 LAB — PROLACTIN: Prolactin: 6 ng/mL (ref 4.8–33.4)

## 2022-06-27 ENCOUNTER — Ambulatory Visit: Admit: 2022-06-27 | Discharge: 2022-06-28 | Payer: PRIVATE HEALTH INSURANCE

## 2022-06-27 DIAGNOSIS — R55 Syncope and collapse: Secondary | ICD-10-CM | POA: Diagnosis not present

## 2022-06-27 DIAGNOSIS — T781XXA Other adverse food reactions, not elsewhere classified, initial encounter: Secondary | ICD-10-CM | POA: Diagnosis not present

## 2022-06-27 MED ORDER — EPINEPHRINE 0.3 MG/0.3 ML INJECTION, AUTO-INJECTOR
Freq: Once | INTRAMUSCULAR | 0 refills | 1 days | Status: CP
Start: 2022-06-27 — End: 2022-06-27

## 2022-06-27 MED ORDER — KETOTIFEN FUMARATE (BULK) 100 % POWDER
Freq: Every day | 11 refills | 0 days | Status: CP
Start: 2022-06-27 — End: 2023-06-27

## 2022-06-27 MED ORDER — CROMOLYN 100 MG/5 ML ORAL CONCENTRATE
Freq: Two times a day (BID) | ORAL | 11 refills | 30 days | Status: CP
Start: 2022-06-27 — End: 2023-06-27

## 2022-07-20 DIAGNOSIS — Z419 Encounter for procedure for purposes other than remedying health state, unspecified: Secondary | ICD-10-CM | POA: Diagnosis not present

## 2022-08-20 DIAGNOSIS — Z419 Encounter for procedure for purposes other than remedying health state, unspecified: Secondary | ICD-10-CM | POA: Diagnosis not present

## 2022-09-12 ENCOUNTER — Telehealth
Admit: 2022-09-12 | Discharge: 2022-09-13 | Payer: PRIVATE HEALTH INSURANCE | Attending: Allergy & Immunology | Primary: Allergy & Immunology

## 2022-09-12 DIAGNOSIS — T7800XA Anaphylactic reaction due to unspecified food, initial encounter: Secondary | ICD-10-CM | POA: Diagnosis not present

## 2022-09-12 DIAGNOSIS — Z91014 Allergy to mammalian meats: Secondary | ICD-10-CM | POA: Diagnosis not present

## 2022-09-12 DIAGNOSIS — Z91018 Allergy to other foods: Secondary | ICD-10-CM | POA: Diagnosis not present

## 2022-09-16 MED ORDER — XOLAIR 150 MG/ML SUBCUTANEOUS SYRINGE
SUBCUTANEOUS | 11 refills | 28 days | Status: CP
Start: 2022-09-16 — End: ?
  Filled 2022-09-25: qty 1, 28d supply, fill #0

## 2022-09-17 DIAGNOSIS — T7800XA Anaphylactic reaction due to unspecified food, initial encounter: Principal | ICD-10-CM

## 2022-09-17 DIAGNOSIS — T782XXD Anaphylactic shock, unspecified, subsequent encounter: Principal | ICD-10-CM

## 2022-09-17 DIAGNOSIS — Z91018 Allergy to other foods: Principal | ICD-10-CM

## 2022-09-17 DIAGNOSIS — Z91014 Allergy to mammalian meats: Principal | ICD-10-CM

## 2022-09-19 DIAGNOSIS — Z419 Encounter for procedure for purposes other than remedying health state, unspecified: Secondary | ICD-10-CM | POA: Diagnosis not present

## 2022-09-19 NOTE — Unmapped (Signed)
[facility] Specialty Medication Onboarding    Specialty Medication: Xolair  Prior Authorization: Approved   Financial Assistance: No - copay  <$25  Final Copay/Day Supply: $4 / 28    Insurance Restrictions: None     Notes to Pharmacist: None  Credit Card on File: no    The triage team has completed the benefits investigation and has determined that the patient is able to fill this medication at [facility]. Please contact the patient to complete the onboarding or follow up with the prescribing physician as needed.

## 2022-09-20 NOTE — Unmapped (Signed)
This patient is receiving Xolair as a clinic administered medication. Pharmacist reviewed the prescription and the patient's chart and determined that therapy is appropriate.  Patient is: aware of copay $4 . All future communication to occur between the [facility] and the clinic. This patient is being disenrolled from our specialty management program and will be added to a patient list for appropriate follow up. Ms. Robe confirmed she has an epi-pen on hand.      [facility] Clinic Administered Medication Refill Coordination Note    NAME:Monserrate D Weber DOB: [birth year]      Medication: Xolair    Day Supply: 28 days      SHIPPING    Next delivery from [facility] [phone]) to [facility] for Eldora Weathers Darrah is scheduled for 09/26/22.    Clinic contact: Teodoro Kil, CMA    Patient's next nurse visit for administration: TBD - pt is aware first 3 doses must be given in clinic.    We will follow up with clinic monthly for standard refill processing and delivery.      Oliva Bustard, PharmD  Specialty Pharmacy Pharmacist

## 2022-10-01 ENCOUNTER — Institutional Professional Consult (permissible substitution): Admit: 2022-10-01 | Discharge: 2022-10-02 | Payer: PRIVATE HEALTH INSURANCE

## 2022-10-01 DIAGNOSIS — Z91018 Allergy to other foods: Secondary | ICD-10-CM | POA: Diagnosis not present

## 2022-10-01 MED ADMIN — omalizumab (XOLAIR) injection 150 mg: 150 mg | SUBCUTANEOUS | @ 14:00:00 | Stop: 2022-12-24

## 2022-10-01 NOTE — Unmapped (Signed)
Patient tolerated 1st Xolair injections well. Patient received 150 mg subcutaneous. Patient will remain in clinic for 120 minutes for monitoring.    Specialty pharmacy    Teodoro Kil, CMA

## 2022-10-08 NOTE — Unmapped (Signed)
Patient called into clinic reporting symptoms after receiving 1st Xolair injection last week including difficulty eating, feeling very nauseous, vomitting, severe dry mouth, and unable to keep food down. Patient stated she has been trying to keep hydrated, but she is still feeling faint from lack of food. No reports of fever. RN reported she would inform Allergist and that it would be best for patient to inform her PCP. Please advise patient.

## 2022-10-16 NOTE — Unmapped (Signed)
[facility] Pharmacy Clinic Administered Medication Refill Coordination Note      NAME:Jessica Pollard DOB: 1981/03/08      Medication: Xolair    Day Supply: 28 days      SHIPPING      Next delivery from [facility] [phone]) to [facility] at [facility] for Jessica Pollard is scheduled for 06/06.    Clinic contact: Teodoro Kil    Patient's next nurse visit for administration: 06/10.    We will follow up with clinic monthly for standard refill processing and delivery.      Samik Balkcom Samella Parr  Specialty Pharmacy Technician

## 2022-10-20 DIAGNOSIS — Z419 Encounter for procedure for purposes other than remedying health state, unspecified: Secondary | ICD-10-CM | POA: Diagnosis not present

## 2022-10-24 MED FILL — XOLAIR 150 MG/ML SUBCUTANEOUS SYRINGE: SUBCUTANEOUS | 28 days supply | Qty: 1 | Fill #1

## 2022-11-13 NOTE — Unmapped (Signed)
Specialty Medication(s): Xolair    Jessica Pollard has been dis-enrolled from the [facility] specialty pharmacy services due to  pt decided to dis-continue  .    Additional information provided to the patient:     Antonietta Barcelona  [facility] Specialty Pharmacist

## 2022-11-19 DIAGNOSIS — Z419 Encounter for procedure for purposes other than remedying health state, unspecified: Secondary | ICD-10-CM | POA: Diagnosis not present

## 2022-12-20 DIAGNOSIS — Z419 Encounter for procedure for purposes other than remedying health state, unspecified: Secondary | ICD-10-CM | POA: Diagnosis not present

## 2022-12-26 ENCOUNTER — Other Ambulatory Visit: Payer: Self-pay

## 2022-12-26 ENCOUNTER — Emergency Department (HOSPITAL_COMMUNITY)
Admission: EM | Admit: 2022-12-26 | Discharge: 2022-12-26 | Disposition: A | Discharge status: Home / Self Care | Payer: Medicaid Other | Attending: Emergency Medicine | Admitting: Emergency Medicine

## 2022-12-26 DIAGNOSIS — R9431 Abnormal electrocardiogram [ECG] [EKG]: Secondary | ICD-10-CM | POA: Diagnosis not present

## 2022-12-26 DIAGNOSIS — R251 Tremor, unspecified: Secondary | ICD-10-CM | POA: Diagnosis not present

## 2022-12-26 DIAGNOSIS — R569 Unspecified convulsions: Secondary | ICD-10-CM | POA: Diagnosis not present

## 2022-12-26 DIAGNOSIS — R0689 Other abnormalities of breathing: Secondary | ICD-10-CM | POA: Diagnosis not present

## 2022-12-26 DIAGNOSIS — Z91018 Allergy to other foods: Secondary | ICD-10-CM

## 2022-12-26 DIAGNOSIS — F430 Acute stress reaction: Secondary | ICD-10-CM

## 2022-12-26 DIAGNOSIS — Z743 Need for continuous supervision: Secondary | ICD-10-CM | POA: Diagnosis not present

## 2022-12-26 DIAGNOSIS — R404 Transient alteration of awareness: Secondary | ICD-10-CM | POA: Diagnosis not present

## 2022-12-26 DIAGNOSIS — G40909 Epilepsy, unspecified, not intractable, without status epilepticus: Secondary | ICD-10-CM | POA: Diagnosis not present

## 2022-12-26 LAB — I-STAT CHEM 8, ED
BUN: 11 mg/dL (ref 6–20)
Calcium, Ion: 1.15 mmol/L (ref 1.15–1.40)
Chloride: 111 mmol/L (ref 98–111)
Creatinine, Ser: 0.9 mg/dL (ref 0.44–1.00)
Glucose, Bld: 81 mg/dL (ref 70–99)
HCT: 37 % (ref 36.0–46.0)
Hemoglobin: 12.6 g/dL (ref 12.0–15.0)
Potassium: 4.4 mmol/L (ref 3.5–5.1)
Sodium: 139 mmol/L (ref 135–145)
TCO2: 17 mmol/L — ABNORMAL LOW (ref 22–32)

## 2022-12-26 MED ORDER — DIPHENHYDRAMINE HCL 50 MG/ML IJ SOLN
25.0000 mg | Freq: Once | INTRAMUSCULAR | Status: AC
  Administered 2022-12-26: 25 mg via INTRAVENOUS
  Filled 2022-12-26: qty 1

## 2022-12-26 NOTE — ED Notes (Signed)
MD Steinl at bedside. 

## 2022-12-26 NOTE — ED Notes (Addendum)
Seizure pads placed on stretcher due to patient shaking

## 2022-12-26 NOTE — ED Notes (Signed)
Patient given applesauce to eat, and ambulated to the restroom independently.

## 2022-12-26 NOTE — ED Triage Notes (Signed)
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  No fall reported.  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  Pt did give herself a injection of Epi prior to EMS arrival.  [facility] gave 5 IM apralozam.  VS HR 97 CBG 97, SpO2 96% RA, Resp 24.  20g left forearm.

## 2022-12-26 NOTE — Discharge Instructions (Addendum)
It was our pleasure to provide your ER care today - we hope that you feel better.  Drink plenty of fluids/stay well hydrated. Avoid exposure to meats or other allergens.   Follow up with primary care doctor in the coming week.  Return to ER if worse, new symptoms, fevers, chest pain, trouble breathing, fainting, or other concern.  You were given medication by EMS and in the ER - no driving for the next 8 hours.

## 2022-12-26 NOTE — ED Provider Notes (Signed)
[place] EMERGENCY DEPARTMENT AT [facility] Provider Note   CSN: [id number] Arrival date & time: 12/26/22  8469     History  No chief complaint on file.   Sandra Meyer is a 42 y.o. female.  Patient with shaking episode at work. Per report, no fall or syncope, rather patient noted to having shaking episode during which has been able to speak. Reported hx 'alpha-gal'. Pt not currently providing history - level 5 caveat. No incontinence. No oral injury. No report of fevers. EMS reports CBG 97. EMS did give versed IM.   The history is provided by the patient, medical records and the EMS personnel. The history is limited by the condition of the patient.       Home Medications Prior to Admission medications   Medication Sig Start Date End Date Taking? Authorizing Provider  EPINEPHrine (EPIPEN 2-PAK) 0.3 mg/0.3 mL IJ SOAJ injection Inject 0.3 mg into the muscle as needed for anaphylaxis. 06/21/22   Leatha Gilding, MD  famotidine (PEPCID) 20 MG tablet Take 1 tablet (20 mg total) by mouth 2 (two) times daily. 06/21/22   Leatha Gilding, MD  ibuprofen (ADVIL) 200 MG tablet Take 800 mg by mouth daily as needed for headache, mild pain or moderate pain.    [provider]  NON FORMULARY Take 1 mL by mouth daily as needed (pain). CBD oil    [provider]  NON FORMULARY Take 2 each by mouth daily as needed (pain). CBD chews    [provider]      Allergies    Codeine, Glycerin, Meat extract, Penicillins, and Alpha-gal    Review of Systems   Review of Systems  Unable to perform ROS: Patient unresponsive    Physical Exam Updated Vital Signs BP 127/79 (BP Location: Left Arm)   Pulse (!) 116   Temp 98.4 F (36.9 C) (Oral)   Resp (!) 24   Ht 1.803 m (5\' 11" )   Wt 77 kg   SpO2 98%   BMI 23.68 kg/m  Physical Exam Vitals and nursing note reviewed.  Constitutional:      Appearance: Normal appearance. She is well-developed.  HENT:     Head:  Atraumatic.     Nose: Nose normal.     Mouth/Throat:     Mouth: Mucous membranes are moist.  Eyes:     General: No scleral icterus.    Extraocular Movements: Extraocular movements intact.     Conjunctiva/sclera: Conjunctivae normal.     Pupils: Pupils are equal, round, and reactive to light.  Neck:     Vascular: No carotid bruit.     Trachea: No tracheal deviation.     Comments: No stiffness or rigidity.  Cardiovascular:     Rate and Rhythm: Normal rate and regular rhythm.     Pulses: Normal pulses.     Heart sounds: Normal heart sounds. No murmur heard.    No friction rub. No gallop.  Pulmonary:     Effort: Pulmonary effort is normal. No respiratory distress.     Breath sounds: Normal breath sounds.  Abdominal:     General: Bowel sounds are normal. There is no distension.     Palpations: Abdomen is soft.     Tenderness: There is no abdominal tenderness.  Genitourinary:    Comments: No cva tenderness.  Musculoskeletal:        General: No swelling or tenderness.     Cervical back: Normal range of motion and neck  supple. No rigidity. No muscular tenderness.     Comments: CTLS spine, non tender, aligned, no step off. Good rom bil extremities without pain or focal bony tenderness.  Skin:    General: Skin is warm and dry.     Findings: No rash.  Neurological:     Mental Status: She is alert.     Comments: Awake, holds eyes closed voluntarily. Patient turns/lies to side, and exhibits gross shaking movements of bilateral extremities, which appears inconsistent and voluntary. If hand/arm dropped across head/face area, patient purposefully redirects arm to side.      ED Results / Procedures / Treatments   Labs (all labs ordered are listed, but only abnormal results are displayed) Results for orders placed or performed during the hospital encounter of 12/26/22  I-stat chem 8, ED  Result Value Ref Range   Sodium 139 135 - 145 mmol/L   Potassium 4.4 3.5 - 5.1 mmol/L   Chloride  111 98 - 111 mmol/L   BUN 11 6 - 20 mg/dL   Creatinine, Ser 1.61 0.44 - 1.00 mg/dL   Glucose, Bld 81 70 - 99 mg/dL   Calcium, Ion 0.96 0.45 - 1.40 mmol/L   TCO2 17 (L) 22 - 32 mmol/L   Hemoglobin 12.6 12.0 - 15.0 g/dL   HCT 40.9 81.1 - 91.4 %     EKG EKG Interpretation Date/Time:  Wednesday December 26 2022 10:12:27 EDT Ventricular Rate:  83 PR Interval:  145 QRS Duration:  93 QT Interval:  378 QTC Calculation: 445 R Axis:   89  Text Interpretation: Sinus rhythm No significant change since last tracing Confirmed by Cathren Laine ([id number]) on 12/26/2022 10:18:49 AM  Radiology No results found.  Procedures Procedures    Medications Ordered in ED Medications - No data to display  ED Course/ Medical Decision Making/ A&P                                 Medical Decision Making Problems Addressed: Acute stress reaction causing mixed disturbance of emotion and conduct: acute illness or injury with systemic symptoms Allergy to alpha-gal: chronic illness or injury with exacerbation, progression, or side effects of treatment that poses a threat to life or bodily functions Episode of shaking: acute illness or injury with systemic symptoms Nonepileptic episode [facility]): acute illness or injury with systemic symptoms that poses a threat to life or bodily functions  Risk Prescription drug management.   Iv ns. Continuous pulse ox and cardiac monitoring. Labs ordered/sent.   Differential diagnosis includes seizure, non-epileptic shaking episode, anxiety/stress reaction, etc. Dispo decision including potential need for admission considered - will get labs and reassess.   Reviewed nursing notes and prior charts for additional history. External reports reviewed. Prior similar episode noted - on review of that chart and additional information, that episode does not appear c/w anaphylactic or alpha-gal type of reaction. Current and prior symptoms appear to be c/w an acute stress/anxiety reaction  possibly precipitated by exposure to meat/odor of meat.    Additional history from: EMS.   Cardiac monitor: sinus rhythm, rate 100.  Labs reviewed/interpreted by me - chem normal.   Recheck, pt calm and alert, oriented. Po fluids/food. Ambulate in hall.  No tremor or shakes. Hr 66, rr 14. Bp normal. Pt denies any current c/o and requests d/c. Pt without rash, itching, wheezing/sob, nausea/vomiting/diarrhea or other symptoms. Pt currently appears stable for d/c.   Rec close  pcp f/u.  Return precautions provided.         Final Clinical Impression(s) / ED Diagnoses Final diagnoses:  None    Rx / DC Orders ED Discharge Orders     None         Cathren Laine, MD 12/26/22 1225

## 2022-12-26 NOTE — ED Notes (Signed)
Water given. Pt tolerating

## 2023-01-20 DIAGNOSIS — Z419 Encounter for procedure for purposes other than remedying health state, unspecified: Secondary | ICD-10-CM | POA: Diagnosis not present

## 2023-02-19 DIAGNOSIS — Z419 Encounter for procedure for purposes other than remedying health state, unspecified: Secondary | ICD-10-CM | POA: Diagnosis not present

## 2023-03-13 ENCOUNTER — Telehealth
Admit: 2023-03-13 | Discharge: 2023-03-14 | Payer: PRIVATE HEALTH INSURANCE | Attending: Allergy & Immunology | Primary: Allergy & Immunology

## 2023-03-13 DIAGNOSIS — T781XXA Other adverse food reactions, not elsewhere classified, initial encounter: Secondary | ICD-10-CM | POA: Diagnosis not present

## 2023-03-13 DIAGNOSIS — R55 Syncope and collapse: Secondary | ICD-10-CM | POA: Diagnosis not present

## 2023-03-13 DIAGNOSIS — T782XXD Anaphylactic shock, unspecified, subsequent encounter: Secondary | ICD-10-CM | POA: Diagnosis not present

## 2023-03-13 DIAGNOSIS — Z91018 Allergy to other foods: Secondary | ICD-10-CM | POA: Diagnosis not present

## 2023-03-13 DIAGNOSIS — W57XXXS Bitten or stung by nonvenomous insect and other nonvenomous arthropods, sequela: Principal | ICD-10-CM

## 2023-03-15 DIAGNOSIS — T782XXD Anaphylactic shock, unspecified, subsequent encounter: Secondary | ICD-10-CM | POA: Diagnosis not present

## 2023-03-15 DIAGNOSIS — T781XXA Other adverse food reactions, not elsewhere classified, initial encounter: Secondary | ICD-10-CM | POA: Diagnosis not present

## 2023-03-15 DIAGNOSIS — Z91018 Allergy to other foods: Secondary | ICD-10-CM | POA: Diagnosis not present

## 2023-03-15 DIAGNOSIS — W57XXXS Bitten or stung by nonvenomous insect and other nonvenomous arthropods, sequela: Secondary | ICD-10-CM | POA: Diagnosis not present

## 2023-03-22 DIAGNOSIS — Z419 Encounter for procedure for purposes other than remedying health state, unspecified: Secondary | ICD-10-CM | POA: Diagnosis not present

## 2023-04-21 DIAGNOSIS — Z419 Encounter for procedure for purposes other than remedying health state, unspecified: Secondary | ICD-10-CM | POA: Diagnosis not present

## 2023-05-22 DIAGNOSIS — Z419 Encounter for procedure for purposes other than remedying health state, unspecified: Secondary | ICD-10-CM | POA: Diagnosis not present

## 2023-06-06 DIAGNOSIS — M5441 Lumbago with sciatica, right side: Secondary | ICD-10-CM | POA: Diagnosis not present

## 2023-06-22 DIAGNOSIS — Z419 Encounter for procedure for purposes other than remedying health state, unspecified: Secondary | ICD-10-CM | POA: Diagnosis not present

## 2023-06-25 DIAGNOSIS — M5441 Lumbago with sciatica, right side: Secondary | ICD-10-CM | POA: Diagnosis not present

## 2023-07-09 DIAGNOSIS — M5441 Lumbago with sciatica, right side: Secondary | ICD-10-CM | POA: Diagnosis not present

## 2023-07-09 DIAGNOSIS — Z7409 Other reduced mobility: Secondary | ICD-10-CM | POA: Diagnosis not present

## 2023-07-19 DIAGNOSIS — M5441 Lumbago with sciatica, right side: Secondary | ICD-10-CM | POA: Diagnosis not present

## 2023-07-19 DIAGNOSIS — Z7409 Other reduced mobility: Secondary | ICD-10-CM | POA: Diagnosis not present

## 2023-07-20 DIAGNOSIS — Z419 Encounter for procedure for purposes other than remedying health state, unspecified: Secondary | ICD-10-CM | POA: Diagnosis not present

## 2023-07-22 DIAGNOSIS — Z7409 Other reduced mobility: Secondary | ICD-10-CM | POA: Diagnosis not present

## 2023-07-22 DIAGNOSIS — M5441 Lumbago with sciatica, right side: Secondary | ICD-10-CM | POA: Diagnosis not present

## 2023-08-01 DIAGNOSIS — M5441 Lumbago with sciatica, right side: Secondary | ICD-10-CM | POA: Diagnosis not present

## 2023-08-01 DIAGNOSIS — Z7409 Other reduced mobility: Secondary | ICD-10-CM | POA: Diagnosis not present

## 2023-08-12 DIAGNOSIS — M5441 Lumbago with sciatica, right side: Secondary | ICD-10-CM | POA: Diagnosis not present

## 2023-08-12 DIAGNOSIS — Z7409 Other reduced mobility: Secondary | ICD-10-CM | POA: Diagnosis not present

## 2023-08-29 DIAGNOSIS — Z91018 Allergy to other foods: Secondary | ICD-10-CM | POA: Diagnosis not present

## 2023-08-29 DIAGNOSIS — R569 Unspecified convulsions: Secondary | ICD-10-CM | POA: Diagnosis not present

## 2023-08-29 DIAGNOSIS — M25551 Pain in right hip: Secondary | ICD-10-CM | POA: Diagnosis not present

## 2023-08-29 MED ORDER — EPINEPHRINE 0.3 MG/0.3 ML INJECTION, AUTO-INJECTOR
Freq: Once | INTRAMUSCULAR | 0 refills | 1.00 days | Status: CP
Start: 2023-08-29 — End: 2023-08-29

## 2023-08-31 DIAGNOSIS — Z419 Encounter for procedure for purposes other than remedying health state, unspecified: Secondary | ICD-10-CM | POA: Diagnosis not present

## 2023-09-17 DIAGNOSIS — R569 Unspecified convulsions: Secondary | ICD-10-CM | POA: Diagnosis not present

## 2023-09-30 DIAGNOSIS — W57XXXA Bitten or stung by nonvenomous insect and other nonvenomous arthropods, initial encounter: Secondary | ICD-10-CM | POA: Diagnosis not present

## 2023-09-30 DIAGNOSIS — S30861A Insect bite (nonvenomous) of abdominal wall, initial encounter: Secondary | ICD-10-CM | POA: Diagnosis not present

## 2023-09-30 DIAGNOSIS — Z419 Encounter for procedure for purposes other than remedying health state, unspecified: Secondary | ICD-10-CM | POA: Diagnosis not present

## 2023-10-17 DIAGNOSIS — J322 Chronic ethmoidal sinusitis: Secondary | ICD-10-CM | POA: Diagnosis not present

## 2023-10-17 DIAGNOSIS — R569 Unspecified convulsions: Secondary | ICD-10-CM | POA: Diagnosis not present

## 2023-10-25 DIAGNOSIS — R569 Unspecified convulsions: Secondary | ICD-10-CM | POA: Diagnosis not present

## 2023-10-31 DIAGNOSIS — Z419 Encounter for procedure for purposes other than remedying health state, unspecified: Secondary | ICD-10-CM | POA: Diagnosis not present

## 2023-11-03 DIAGNOSIS — Z91014 Allergy to mammalian meats: Secondary | ICD-10-CM | POA: Diagnosis not present

## 2023-11-03 DIAGNOSIS — R259 Unspecified abnormal involuntary movements: Secondary | ICD-10-CM | POA: Diagnosis not present

## 2023-11-03 DIAGNOSIS — R9401 Abnormal electroencephalogram [EEG]: Secondary | ICD-10-CM | POA: Diagnosis not present

## 2023-11-30 DIAGNOSIS — Z419 Encounter for procedure for purposes other than remedying health state, unspecified: Secondary | ICD-10-CM | POA: Diagnosis not present

## 2023-12-19 DIAGNOSIS — R251 Tremor, unspecified: Secondary | ICD-10-CM | POA: Diagnosis not present

## 2023-12-31 DIAGNOSIS — Z419 Encounter for procedure for purposes other than remedying health state, unspecified: Secondary | ICD-10-CM | POA: Diagnosis not present

## 2024-01-31 DIAGNOSIS — Z419 Encounter for procedure for purposes other than remedying health state, unspecified: Secondary | ICD-10-CM | POA: Diagnosis not present

## 2024-04-14 ENCOUNTER — Emergency Department (HOSPITAL_BASED_OUTPATIENT_CLINIC_OR_DEPARTMENT_OTHER)
Admission: EM | Admit: 2024-04-14 | Discharge: 2024-04-14 | Disposition: A | Discharge status: Home / Self Care | Attending: Emergency Medicine | Admitting: Emergency Medicine

## 2024-04-14 ENCOUNTER — Other Ambulatory Visit: Payer: Self-pay

## 2024-04-14 ENCOUNTER — Encounter (HOSPITAL_BASED_OUTPATIENT_CLINIC_OR_DEPARTMENT_OTHER): Payer: Self-pay | Admitting: Emergency Medicine

## 2024-04-14 ENCOUNTER — Emergency Department (HOSPITAL_BASED_OUTPATIENT_CLINIC_OR_DEPARTMENT_OTHER)

## 2024-04-14 DIAGNOSIS — R479 Unspecified speech disturbances: Secondary | ICD-10-CM | POA: Insufficient documentation

## 2024-04-14 LAB — BASIC METABOLIC PANEL WITH GFR
Anion gap: 8 (ref 5–15)
BUN: 9 mg/dL (ref 6–20)
CO2: 26 mmol/L (ref 22–32)
Calcium: 9.9 mg/dL (ref 8.9–10.3)
Chloride: 107 mmol/L (ref 98–111)
Creatinine, Ser: 0.72 mg/dL (ref 0.44–1.00)
GFR, Estimated: >60 mL/min (ref >60)
Glucose, Bld: 92 mg/dL (ref 70–99)
Potassium: 4.6 mmol/L (ref 3.5–5.1)
Sodium: 140 mmol/L (ref 135–145)

## 2024-04-14 LAB — CBC
HCT: 40.2 % (ref 36.0–46.0)
Hemoglobin: 13.4 g/dL (ref 12.0–15.0)
MCH: 31.2 pg (ref 26.0–34.0)
MCHC: 33.3 g/dL (ref 30.0–36.0)
MCV: 93.7 fL (ref 80.0–100.0)
Platelets: 196 K/uL (ref 150–400)
RBC: 4.29 MIL/uL (ref 3.87–5.11)
RDW: 12.9 % (ref 11.5–15.5)
WBC: 5.6 K/uL (ref 4.0–10.5)
nRBC: 0 % (ref 0.0–0.2)

## 2024-04-14 NOTE — Discharge Instructions (Addendum)
 Please call your neurologist for follow-up in the next 1 to 2 weeks. Please follow-up with your primary care doctor Avoid all inciting agents including fumes. Return if you are having any new or worsening symptoms

## 2024-04-14 NOTE — ED Provider Notes (Signed)
 [place] EMERGENCY DEPARTMENT AT [facility] Provider Note   CSN: [id number] Arrival date & time: 04/14/24  9042     Patient presents with: Aphasia   Sandra Meyer is a 43 y.o. female.   HPI 43 year old female reports history of alpha gal syndrome presents today stating that she had exposure to pork at work a week ago.  At that time, she reports that she had symptoms consistent with grand mal seizure with shaking for an hour to an hour and a half.  She has not had ongoing symptoms since that time.  She previously had similar symptoms and has been seen by [place] neurology and she states she has been told that she has panic attacks.  Since last week she has had difficulty speaking.  No difficulty walking.  She reports that she has been diagnosed with alpha gal syndrome 6 years ago by her gynecologist.    Prior to Admission medications   Medication Sig Start Date End Date Taking? Authorizing Provider  EPINEPHrine  (EPIPEN  2-PAK) 0.3 mg/0.3 mL IJ SOAJ injection Inject 0.3 mg into the muscle as needed for anaphylaxis. 06/21/22   Gherghe, Costin M, MD  famotidine  (PEPCID ) 20 MG tablet Take 1 tablet (20 mg total) by mouth 2 (two) times daily. 06/21/22   Gherghe, Costin M, MD  ibuprofen  (ADVIL ) 200 MG tablet Take 800 mg by mouth daily as needed for headache, mild pain or moderate pain.    [provider]  NON FORMULARY Take 1 mL by mouth daily as needed (pain). CBD oil    [provider]  NON FORMULARY Take 2 each by mouth daily as needed (pain). CBD chews    [provider]    Allergies: Codeine, Glycerin , Meat extract, Penicillins, and Alpha-gal    Review of Systems  Updated Vital Signs BP 124/87 (BP Location: Right Arm)   Pulse 74   Temp 97.9 F (36.6 C) (Oral)   Resp 18   SpO2 100%   Physical Exam Vitals and nursing note reviewed.  Constitutional:      General: She is not in acute distress.    Appearance: She is well-developed.  HENT:      Head: Normocephalic and atraumatic.     Right Ear: External ear normal.     Left Ear: External ear normal.     Nose: Nose normal.  Eyes:     Conjunctiva/sclera: Conjunctivae normal.     Pupils: Pupils are equal, round, and reactive to light.  Cardiovascular:     Rate and Rhythm: Normal rate and regular rhythm.  Pulmonary:     Effort: Pulmonary effort is normal.  Abdominal:     General: Abdomen is flat.     Palpations: Abdomen is soft.  Musculoskeletal:        General: Normal range of motion.     Cervical back: Normal range of motion and neck supple.  Skin:    General: Skin is warm and dry.     Capillary Refill: Capillary refill takes less than 2 seconds.  Neurological:     Mental Status: She is alert and oriented to person, place, and time.     Motor: No abnormal muscle tone.     Coordination: Coordination normal.     Comments: Speech with some hesitation and pattern but no slurring No palmar drift Dilatory without difficulty  Psychiatric:        Behavior: Behavior normal.        Thought Content: Thought content normal.     (  all labs ordered are listed, but only abnormal results are displayed) Labs Reviewed  CBC  BASIC METABOLIC PANEL WITH GFR    EKG: None  Radiology: CT Head Wo Contrast Result Date: 04/14/2024 EXAM: CT HEAD WITHOUT 04/14/2024 10:43:10 AM TECHNIQUE: CT of the head was performed without the administration of intravenous contrast. Automated exposure control, iterative reconstruction, and/or weight based adjustment of the mA/kV was utilized to reduce the radiation dose to as low as reasonably achievable. COMPARISON: CT of the head dated 06/19/2022. CLINICAL HISTORY: Headache, neuro deficit. FINDINGS: BRAIN AND VENTRICLES: No acute intracranial hemorrhage. No mass effect or midline shift. No extra-axial fluid collection. No evidence of acute infarct. No hydrocephalus. ORBITS: No acute abnormality. SINUSES AND MASTOIDS: Mucosal thickening in ethmoid air cells.  SOFT TISSUES AND SKULL: No acute skull fracture. No acute soft tissue abnormality. IMPRESSION: 1. No acute intracranial abnormality. 2. Mucosal thickening in ethmoid air cells. Electronically signed by: Evalene Coho MD 04/14/2024 10:58 AM EST RP Workstation: [id number]     Procedures   Medications Ordered in the ED - No data to display  Clinical Course as of 04/14/24 1152  Tue Apr 14, 2024  1130 CT head reviewed interpreted normal with no acute intracranial abnormality noted by radiologist on their impression.  There were mucosal thickening in ethmoid air cells noted [DR]    Clinical Course User Index [DR] Levander Houston, MD                                 Medical Decision Making Amount and/or Complexity of Data Reviewed Labs: ordered. Radiology: ordered.   43 year old female with history of alpha gal syndrome presents today with reports that she had a reaction a week ago and has continued to have difficulty forming words and having a right sided headache since that time.  Patient seen and evaluated with physical exam that showed some hesitation in speech but no other focal neurological deficits. Patient was evaluated with head CT which shows no evidence of acute intracranial abnormality Patient was evaluated with labs that are normal. Patient has primary care physician and neurologist. Patient to avoid any further contact with inciting agents. Patient to return if she is having any new or worsening symptoms.  Patient advised to follow-up with primary care and neurologist     Final diagnoses:  Speech disturbance, unspecified type    ED Discharge Orders     None          Levander Houston, MD 04/14/24 1152

## 2024-04-14 NOTE — ED Triage Notes (Signed)
 Reports slurred speech and aphasia  with headaches x 1 week. States has alpha-gal reaction last week when symptoms started. Generalized tremors. No unilateral deficits.
